# Patient Record
Sex: Female | Born: 1960 | Race: White | Hispanic: No | Marital: Married | Smoking: Former smoker
Health system: Southern US, Community
[De-identification: ages and names within clinical notes are randomized; demographics above are authoritative.]

## PROBLEM LIST (undated history)

## (undated) DIAGNOSIS — F329 Major depressive disorder, single episode, unspecified: Secondary | ICD-10-CM

## (undated) DIAGNOSIS — F419 Anxiety disorder, unspecified: Secondary | ICD-10-CM

## (undated) DIAGNOSIS — I10 Essential (primary) hypertension: Secondary | ICD-10-CM

## (undated) DIAGNOSIS — F32A Depression, unspecified: Secondary | ICD-10-CM

## (undated) DIAGNOSIS — F319 Bipolar disorder, unspecified: Secondary | ICD-10-CM

## (undated) DIAGNOSIS — T7840XA Allergy, unspecified, initial encounter: Secondary | ICD-10-CM

## (undated) DIAGNOSIS — I7300 Raynaud's syndrome without gangrene: Secondary | ICD-10-CM

## (undated) DIAGNOSIS — I499 Cardiac arrhythmia, unspecified: Secondary | ICD-10-CM

## (undated) DIAGNOSIS — M1990 Unspecified osteoarthritis, unspecified site: Secondary | ICD-10-CM

## (undated) DIAGNOSIS — E7800 Pure hypercholesterolemia, unspecified: Secondary | ICD-10-CM

## (undated) HISTORY — DX: Pure hypercholesterolemia, unspecified: E78.00

## (undated) HISTORY — DX: Depression, unspecified: F32.A

## (undated) HISTORY — DX: Anxiety disorder, unspecified: F41.9

## (undated) HISTORY — DX: Bipolar disorder, unspecified: F31.9

## (undated) HISTORY — DX: Allergy, unspecified, initial encounter: T78.40XA

## (undated) HISTORY — DX: Major depressive disorder, single episode, unspecified: F32.9

## (undated) HISTORY — DX: Hemochromatosis, unspecified: E83.119

## (undated) HISTORY — DX: Unspecified osteoarthritis, unspecified site: M19.90

## (undated) HISTORY — DX: Essential (primary) hypertension: I10

## (undated) HISTORY — DX: Raynaud's syndrome without gangrene: I73.00

## (undated) HISTORY — DX: Cardiac arrhythmia, unspecified: I49.9

## (undated) HISTORY — PX: NO PAST SURGERIES: SHX2092

---

## 2006-11-25 ENCOUNTER — Other Ambulatory Visit: Payer: Self-pay

## 2006-11-25 ENCOUNTER — Observation Stay: Payer: Self-pay | Admitting: Internal Medicine

## 2007-07-28 ENCOUNTER — Ambulatory Visit: Payer: Self-pay | Admitting: Unknown Physician Specialty

## 2010-09-20 ENCOUNTER — Ambulatory Visit: Payer: Self-pay | Admitting: Internal Medicine

## 2010-09-25 ENCOUNTER — Ambulatory Visit: Payer: Self-pay | Admitting: Internal Medicine

## 2010-10-11 ENCOUNTER — Ambulatory Visit: Payer: Self-pay | Admitting: Internal Medicine

## 2010-11-27 ENCOUNTER — Ambulatory Visit: Payer: Self-pay | Admitting: Internal Medicine

## 2012-03-05 ENCOUNTER — Ambulatory Visit: Payer: Self-pay | Admitting: Family Medicine

## 2012-05-26 ENCOUNTER — Ambulatory Visit: Payer: Self-pay

## 2012-06-15 ENCOUNTER — Ambulatory Visit: Payer: Self-pay | Admitting: Gastroenterology

## 2013-11-02 ENCOUNTER — Ambulatory Visit: Payer: Self-pay | Admitting: Internal Medicine

## 2013-11-09 LAB — CBC CANCER CENTER
Basophil #: 0.1 x10 3/mm (ref 0.0–0.1)
Basophil %: 1.2 %
Eosinophil #: 0 x10 3/mm (ref 0.0–0.7)
Eosinophil %: 0.5 %
HCT: 40.8 % (ref 35.0–47.0)
HGB: 13.8 g/dL (ref 12.0–16.0)
LYMPHS PCT: 30.6 %
Lymphocyte #: 1.4 x10 3/mm (ref 1.0–3.6)
MCH: 35.1 pg — ABNORMAL HIGH (ref 26.0–34.0)
MCHC: 33.8 g/dL (ref 32.0–36.0)
MCV: 104 fL — AB (ref 80–100)
Monocyte #: 0.5 x10 3/mm (ref 0.2–0.9)
Monocyte %: 10.7 %
Neutrophil #: 2.7 x10 3/mm (ref 1.4–6.5)
Neutrophil %: 57 %
Platelet: 306 x10 3/mm (ref 150–440)
RBC: 3.93 10*6/uL (ref 3.80–5.20)
RDW: 12.8 % (ref 11.5–14.5)
WBC: 4.7 x10 3/mm (ref 3.6–11.0)

## 2013-11-09 LAB — FERRITIN: Ferritin (ARMC): 156 ng/mL (ref 8–388)

## 2013-11-10 ENCOUNTER — Ambulatory Visit: Payer: Self-pay | Admitting: Internal Medicine

## 2013-12-10 ENCOUNTER — Ambulatory Visit: Payer: Self-pay | Admitting: Internal Medicine

## 2013-12-28 ENCOUNTER — Ambulatory Visit: Payer: Self-pay | Admitting: Internal Medicine

## 2013-12-28 LAB — CBC CANCER CENTER
BASOS ABS: 0 x10 3/mm (ref 0.0–0.1)
Basophil %: 1.4 %
Eosinophil #: 0.1 x10 3/mm (ref 0.0–0.7)
Eosinophil %: 2.7 %
HCT: 38 % (ref 35.0–47.0)
HGB: 13.2 g/dL (ref 12.0–16.0)
LYMPHS ABS: 1.4 x10 3/mm (ref 1.0–3.6)
LYMPHS PCT: 42.8 %
MCH: 36 pg — ABNORMAL HIGH (ref 26.0–34.0)
MCHC: 34.9 g/dL (ref 32.0–36.0)
MCV: 103 fL — AB (ref 80–100)
Monocyte #: 0.4 x10 3/mm (ref 0.2–0.9)
Monocyte %: 12.2 %
NEUTROS ABS: 1.4 x10 3/mm (ref 1.4–6.5)
Neutrophil %: 40.9 %
Platelet: 282 x10 3/mm (ref 150–440)
RBC: 3.68 10*6/uL — AB (ref 3.80–5.20)
RDW: 12.2 % (ref 11.5–14.5)
WBC: 3.3 x10 3/mm — AB (ref 3.6–11.0)

## 2013-12-28 LAB — FERRITIN: Ferritin (ARMC): 145 ng/mL (ref 8–388)

## 2014-01-10 ENCOUNTER — Ambulatory Visit: Payer: Self-pay | Admitting: Internal Medicine

## 2014-04-19 ENCOUNTER — Ambulatory Visit: Payer: Self-pay | Admitting: Internal Medicine

## 2015-01-03 ENCOUNTER — Other Ambulatory Visit: Payer: Self-pay

## 2015-01-03 HISTORY — DX: Hemochromatosis, unspecified: E83.119

## 2015-01-04 ENCOUNTER — Inpatient Hospital Stay: Payer: Commercial Managed Care - PPO | Attending: Hematology and Oncology

## 2015-01-04 ENCOUNTER — Inpatient Hospital Stay (HOSPITAL_BASED_OUTPATIENT_CLINIC_OR_DEPARTMENT_OTHER): Payer: Commercial Managed Care - PPO | Admitting: Hematology and Oncology

## 2015-01-04 ENCOUNTER — Inpatient Hospital Stay: Payer: Commercial Managed Care - PPO

## 2015-01-04 ENCOUNTER — Encounter: Payer: Self-pay | Admitting: Hematology and Oncology

## 2015-01-04 DIAGNOSIS — Z79899 Other long term (current) drug therapy: Secondary | ICD-10-CM | POA: Insufficient documentation

## 2015-01-04 DIAGNOSIS — I10 Essential (primary) hypertension: Secondary | ICD-10-CM

## 2015-01-04 DIAGNOSIS — Z87891 Personal history of nicotine dependence: Secondary | ICD-10-CM | POA: Diagnosis not present

## 2015-01-04 LAB — CBC
HCT: 36.7 % (ref 35.0–47.0)
Hemoglobin: 12.7 g/dL (ref 12.0–16.0)
MCH: 35.3 pg — ABNORMAL HIGH (ref 26.0–34.0)
MCHC: 34.6 g/dL (ref 32.0–36.0)
MCV: 101.9 fL — ABNORMAL HIGH (ref 80.0–100.0)
Platelets: 229 10*3/uL (ref 150–440)
RBC: 3.6 MIL/uL — ABNORMAL LOW (ref 3.80–5.20)
RDW: 12.6 % (ref 11.5–14.5)
WBC: 3.7 10*3/uL (ref 3.6–11.0)

## 2015-01-04 LAB — FERRITIN: Ferritin: 114 ng/mL (ref 11–307)

## 2015-01-04 NOTE — Progress Notes (Signed)
Pt has not been seen in 1 year. Had seen Dr Lorre NickGittin in the past for hemochromatosis. Pt doesn't really like MD appts, states she has to get off of work to get there. She is due for her mammogram. She said she would contact her PCP for that appt.

## 2015-01-04 NOTE — Progress Notes (Signed)
[facility]-  Cancer Center  Clinic day:  01/04/2015  Chief Complaint: Lindsey Medina is an 54 y.o. female with hemochromatosis who is seen for initial assessment.  HPI: Patient states that she was diagnosed with hemochromatosis in 2012.  At that time, she presented with "abnormal blood work".  Iron saturation was 66%. 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   She was also noted to have mild neutropenia and macrocytosis.  Differential has included a myelodysplastic syndrome, autoimmune disease, cyclic neutropenia or alcohol use. She has had no issues with recurrent infections.  She has undergone frequent phlebotomy. He states that her ferritin was maintained less than 100 with Dr. Bufford Buttner at the [facility] in [place].  Dr Lorre Nick has maintained a ferritin less than 50.  She was last seen in the medical oncology clinic on 12/28/2013.  At that time, hematocrit was 38, hemoglobin 13.2, MCV 103, platelets 282,000, WBC 3300 with an ANC of 1400.  Ferritin was 145. She underwent a 350 cc phlebotomy.  She became presyncopal.   Review of her chart reveals a workup for her low white count and elevated MCV. On 10/10/2010, she had a normal B12 (410), folic acid and (16.5), ANA, rheumatoid factor, and serum protein electrophoresis. Flow cytometry was negative.  Of note, CBC on 11/09/2013 revealed a WBC 4700 with an ANC 2700 (normal).  Symptomatically, she notes fatigue.  She describes neck and shoulder discomfort for which she takes Aleve.  She has arthritis in her hands.  Past Medical History  Diagnosis Date  . Raynaud phenomenon   . Allergy   . Depression   . Anxiety   . Arthritis   . Bipolar disorder   . Hypercholesteremia   . Hypertension   . Irregular heart beats   . Hemochromatosis 01/03/2015  . Hemochromatosis 01/03/2015   No past surgical history on file.  Family History  Problem Relation Age of Onset  . Leukemia Other   No family history  of hemochromatosis.  Social History:  reports that she quit smoking about 26 years ago. She does not have any smokeless tobacco history on file. Her alcohol and drug histories are not on file.  She drinks 1-2 glasses of wine occasionally.  The patient is alone today.  Allergies: No Known Allergies  Current Medications: Current Outpatient Prescriptions  Medication Sig Dispense Refill  . buPROPion (WELLBUTRIN XL) 150 MG 24 hr tablet Take 300 mg by mouth daily.     . cetirizine (ZYRTEC) 10 MG tablet Take 10 mg by mouth daily.    . clonazePAM (KLONOPIN) 0.5 MG tablet Take 2 mg by mouth.     . spironolactone (ALDACTONE) 50 MG tablet Take 50 mg by mouth 2 (two) times daily.    . divalproex (DEPAKOTE) 250 MG DR tablet Take 250 mg by mouth 2 (two) times daily.    Gerarda Fraction Root 500 MG CAPS Take 1 capsule by mouth 2 (two) times daily.     No current facility-administered medications for this visit.   Review of Systems:  GENERAL:  Stays tired.  Active.  No fevers, sweats or weight loss. PERFORMANCE STATUS (ECOG):  1 HEENT:  No visual changes, runny nose, sore throat, mouth sores or tenderness. Lungs: No shortness of breath or cough.  No hemoptysis. Cardiac:  No chest pain, palpitations, orthopnea, or PND. GI:  No nausea, vomiting, diarrhea, constipation, melena or hematochezia. GU:  No urgency, frequency, dysuria, or hematuria.  Menopause at age 18.  Musculoskeletal:  Neck and shoulders ache.  Arthritis pains in hands.  No muscle tenderness. Extremities:  No pain or swelling. Skin:  No rashes or skin changes. Neuro:  No headache, numbness or weakness, balance or coordination issues. Endocrine:  No diabetes, thyroid issues, hot flashes or night sweats. Psych:  No mood changes, depression or anxiety. Review of systems:  All other systems reviewed and found to be negative.   Physical Exam: Pulse 65, temperature 97.5 F (36.4 C), temperature source Tympanic, height 5' 6.14" (1.68 m), weight 132  lb 4.4 oz (60 kg). GENERAL:  Well developed, well nourished, sitting comfortably in the exam room in no acute distress. MENTAL STATUS:  Alert and oriented to person, place and time. HEAD:  Long dark blonde hair.  Normocephalic, atraumatic, face symmetric, no Cushingoid features. EYES:  Brown eyes.  Pupils equal round and reactive to light and accomodation.  No conjunctivitis or scleral icterus. ENT:  Oropharynx clear without lesion.  Tongue normal. Mucous membranes moist.  RESPIRATORY:  Clear to auscultation without rales, wheezes or rhonchi. CARDIOVASCULAR:  Regular rate and rhythm without murmur, rub or gallop. ABDOMEN:  Soft, non-tender, with active bowel sounds, and no hepatosplenomegaly.  No masses. SKIN:  No rashes, ulcers or lesions. EXTREMITIES: No edema, no skin discoloration or tenderness.  No palpable cords. LYMPH NODES: No palpable cervical, supraclavicular, axillary or inguinal adenopathy  NEUROLOGICAL: Unremarkable. PSYCH:  Appropriate.   Appointment on 01/04/2015  Component Date Value Ref Range Status  . WBC 01/04/2015 3.7  3.6 - 11.0 K/uL Final  . RBC 01/04/2015 3.60* 3.80 - 5.20 MIL/uL Final  . Hemoglobin 01/04/2015 12.7  12.0 - 16.0 g/dL Final  . HCT 16/10/960405/25/2016 36.7  35.0 - 47.0 % Final  . MCV 01/04/2015 101.9* 80.0 - 100.0 fL Final  . MCH 01/04/2015 35.3* 26.0 - 34.0 pg Final  . MCHC 01/04/2015 34.6  32.0 - 36.0 g/dL Final  . RDW 54/09/811905/25/2016 12.6  11.5 - 14.5 % Final  . Platelets 01/04/2015 229  150 - 440 K/uL Final  . Ferritin 01/04/2015 114  11 - 307 ng/mL Final   Assessment:  Lindsey Medina is an 54 y.o. female with hemochromatosis (homozygous H63D).  She undergoes periodic phlebotomy to maintain a ferritin less than 50-100.  Last phlebotomy was 12/28/2013.  At that time, she had a presyncopal event (volume 350 cc).  She has a history of leukopenia and an elevated MCV.  She denies any liver disease. Work-up on 10/10/2010 included a normal B12, folic acid, ANA,  rheumatoid factor, serum protein electrophoresis, and flow cytometry.  CBC on 11/09/2013 revealed a WBC 4700 with an ANC 2700 (normal) with an MCV 104 (elevated).  Her elevated MCV may be secondary to her valproic acid.  Symptomatically, she notes chronic fatigue.  She has arthritis pain in her hands.  Exam is unremarkable.  Plan: 1. Discuss medical history, diagnosis and treatment of hemochromatosis, genetics, complications and monitoring. 2. Labs today:  CBC, ferritin. 3. Discuss ferritin goal.  Will perform phlebotomies if ferritin > 100. 4. Schedule small volume phlebotomy (250-300 cc) this week. 5. Anticipate yearly liver ultrasound. 6. RTC in 6 months for MD assess, labs (CBC, ferritin, hepatitis B and C serologies, AFP), and +/- phlebotomy.   Rosey BathMelissa C Corcoran, MD  01/04/2015, 1:25 PM

## 2015-01-11 ENCOUNTER — Inpatient Hospital Stay: Payer: Commercial Managed Care - PPO | Attending: Hematology and Oncology

## 2015-02-21 ENCOUNTER — Other Ambulatory Visit: Payer: Self-pay | Admitting: Family Medicine

## 2015-02-21 DIAGNOSIS — Z1231 Encounter for screening mammogram for malignant neoplasm of breast: Secondary | ICD-10-CM

## 2015-02-22 ENCOUNTER — Ambulatory Visit: Payer: Commercial Managed Care - PPO

## 2015-02-28 ENCOUNTER — Ambulatory Visit
Admission: RE | Admit: 2015-02-28 | Discharge: 2015-02-28 | Disposition: A | Discharge status: Home / Self Care | Payer: Commercial Managed Care - PPO | Source: Ambulatory Visit | Attending: Family Medicine | Admitting: Family Medicine

## 2015-02-28 DIAGNOSIS — Z1231 Encounter for screening mammogram for malignant neoplasm of breast: Secondary | ICD-10-CM

## 2015-07-14 ENCOUNTER — Other Ambulatory Visit: Payer: Self-pay

## 2015-07-19 ENCOUNTER — Other Ambulatory Visit: Payer: Commercial Managed Care - PPO

## 2015-07-19 ENCOUNTER — Other Ambulatory Visit: Payer: Self-pay | Admitting: Hematology and Oncology

## 2015-07-19 ENCOUNTER — Ambulatory Visit: Payer: Commercial Managed Care - PPO | Admitting: Hematology and Oncology

## 2015-09-14 ENCOUNTER — Ambulatory Visit (INDEPENDENT_AMBULATORY_CARE_PROVIDER_SITE_OTHER): Payer: Commercial Managed Care - PPO | Admitting: Family Medicine

## 2015-09-14 ENCOUNTER — Encounter: Payer: Self-pay | Admitting: Family Medicine

## 2015-09-14 VITALS — BP 122/70 | HR 80 | Ht 64.0 in | Wt 121.0 lb

## 2015-09-14 DIAGNOSIS — J40 Bronchitis, not specified as acute or chronic: Secondary | ICD-10-CM

## 2015-09-14 DIAGNOSIS — J0100 Acute maxillary sinusitis, unspecified: Secondary | ICD-10-CM | POA: Diagnosis not present

## 2015-09-14 DIAGNOSIS — R634 Abnormal weight loss: Secondary | ICD-10-CM

## 2015-09-14 MED ORDER — BENZONATATE 100 MG PO CAPS: 100.0000 mg | ORAL_CAPSULE | Freq: Two times a day (BID) | ORAL | Status: DC | PRN

## 2015-09-14 MED ORDER — AMOXICILLIN 500 MG PO CAPS: 500.0000 mg | ORAL_CAPSULE | Freq: Three times a day (TID) | ORAL | Status: DC

## 2015-09-14 NOTE — Progress Notes (Signed)
Name: Lindsey Medina   MRN: [id number]    DOB: 06-04-1961   Date:09/14/2015       Progress Note  Subjective  Chief Complaint  Chief Complaint  Patient presents with  . Sinusitis    cough and cong    Sinusitis This is a new problem. The current episode started in the past 7 days. The problem has been gradually worsening since onset. The maximum temperature recorded prior to her arrival was 100.4 - 100.9 F. Associated symptoms include chills, congestion, coughing, diaphoresis, ear pain, headaches, sinus pressure, sneezing, a sore throat and swollen glands. Pertinent negatives include no neck pain or shortness of breath. Past treatments include acetaminophen and oral decongestants. The treatment provided mild relief.  Cough This is a new problem. The current episode started in the past 7 days. The cough is productive of purulent sputum and productive of brown sputum. Associated symptoms include chills, ear pain, a fever, headaches, nasal congestion, postnasal drip and a sore throat. Pertinent negatives include no chest pain, heartburn, hemoptysis, myalgias, rash, shortness of breath, weight loss or wheezing. She has tried OTC cough suppressant for the symptoms. The treatment provided no relief. Her past medical history is significant for bronchitis and pneumonia. There is no history of asthma, COPD, emphysema or environmental allergies.    No problem-specific assessment & plan notes found for this encounter.   Past Medical History  Diagnosis Date  . Raynaud phenomenon   . Allergy   . Depression   . Anxiety   . Arthritis   . Bipolar disorder (HCC)   . Hypercholesteremia   . Hypertension   . Irregular heart beats   . Hemochromatosis 01/03/2015  . Hemochromatosis 01/03/2015    History reviewed. No pertinent past surgical history.  Family History  Problem Relation Age of Onset  . Leukemia Other     Social History   Social History  . Marital Status: Married    Spouse Name: N/A  .  Number of Children: N/A  . Years of Education: N/A   Occupational History  . Not on file.   Social History Main Topics  . Smoking status: Former Smoker -- 15 years    Quit date: 12/06/1988  . Smokeless tobacco: Not on file  . Alcohol Use: Not on file  . Drug Use: Not on file  . Sexual Activity: No   Other Topics Concern  . Not on file   Social History Narrative    Allergies  Allergen Reactions  . Morphine Nausea And Vomiting     Review of Systems  Constitutional: Positive for fever, chills and diaphoresis. Negative for weight loss and malaise/fatigue.  HENT: Positive for congestion, ear pain, postnasal drip, sinus pressure, sneezing and sore throat. Negative for ear discharge.   Eyes: Negative for blurred vision.  Respiratory: Positive for cough. Negative for hemoptysis, sputum production, shortness of breath and wheezing.   Cardiovascular: Negative for chest pain, palpitations and leg swelling.  Gastrointestinal: Negative for heartburn, nausea, abdominal pain, diarrhea, constipation, blood in stool and melena.  Genitourinary: Negative for dysuria, urgency, frequency and hematuria.  Musculoskeletal: Negative for myalgias, back pain, joint pain and neck pain.  Skin: Negative for rash.  Neurological: Positive for headaches. Negative for dizziness, tingling, sensory change and focal weakness.  Endo/Heme/Allergies: Positive for polydipsia. Negative for environmental allergies. Does not bruise/bleed easily.       Polyuria  Psychiatric/Behavioral: Negative for depression and suicidal ideas. The patient is not nervous/anxious and does not have insomnia.  Objective  Filed Vitals:   09/14/15 1134  BP: 122/70  Pulse: 80  Height:  (1.626 m)  Weight: 121 lb (54.885 kg)    Physical Exam  Constitutional: She is well-developed, well-nourished, and in no distress. No distress.  HENT:  Head: Normocephalic and atraumatic.  Right Ear: Tympanic membrane, external ear and  ear canal normal.  Left Ear: Tympanic membrane, external ear and ear canal normal.  Nose: Nose normal.  Mouth/Throat: Oropharynx is clear and moist.  Eyes: Conjunctivae and EOM are normal. Pupils are equal, round, and reactive to light. Right eye exhibits no discharge. Left eye exhibits no discharge.  Neck: Normal range of motion. Neck supple. No JVD present. No thyromegaly present.  Cardiovascular: Normal rate, regular rhythm, normal heart sounds and intact distal pulses.  Exam reveals no gallop and no friction rub.   No murmur heard. Pulmonary/Chest: Effort normal and breath sounds normal.  Abdominal: Soft. Bowel sounds are normal. She exhibits no mass. There is no tenderness. There is no guarding.  Musculoskeletal: Normal range of motion. She exhibits no edema.  Lymphadenopathy:    She has no cervical adenopathy.  Neurological: She is alert.  Skin: Skin is warm and dry. She is not diaphoretic.  Psychiatric: Mood and affect normal.  Nursing note and vitals reviewed.     Assessment & Plan  Problem List Items Addressed This Visit    None    Visit Diagnoses    Acute maxillary sinusitis, recurrence not specified    -  Primary    Relevant Medications    amoxicillin (AMOXIL) 500 MG capsule    benzonatate (TESSALON) 100 MG capsule    Bronchitis        Relevant Medications    amoxicillin (AMOXIL) 500 MG capsule    benzonatate (TESSALON) 100 MG capsule    Weight loss             Dr. Hayden [facility]  09/14/2015

## 2015-09-18 ENCOUNTER — Other Ambulatory Visit: Payer: Self-pay | Admitting: Hematology and Oncology

## 2015-09-20 ENCOUNTER — Inpatient Hospital Stay: Payer: Commercial Managed Care - PPO | Attending: Hematology and Oncology

## 2015-09-20 ENCOUNTER — Other Ambulatory Visit
Admission: RE | Admit: 2015-09-20 | Discharge: 2015-09-20 | Disposition: A | Discharge status: Home / Self Care | Payer: Commercial Managed Care - PPO | Source: Ambulatory Visit | Attending: Family Medicine | Admitting: Family Medicine

## 2015-09-20 ENCOUNTER — Inpatient Hospital Stay (HOSPITAL_BASED_OUTPATIENT_CLINIC_OR_DEPARTMENT_OTHER): Payer: Commercial Managed Care - PPO | Admitting: Family Medicine

## 2015-09-20 ENCOUNTER — Inpatient Hospital Stay: Payer: Commercial Managed Care - PPO

## 2015-09-20 ENCOUNTER — Encounter: Payer: Self-pay | Admitting: Family Medicine

## 2015-09-20 ENCOUNTER — Other Ambulatory Visit: Payer: Self-pay | Admitting: Hematology and Oncology

## 2015-09-20 DIAGNOSIS — Z79899 Other long term (current) drug therapy: Secondary | ICD-10-CM | POA: Diagnosis not present

## 2015-09-20 DIAGNOSIS — Z87891 Personal history of nicotine dependence: Secondary | ICD-10-CM | POA: Diagnosis not present

## 2015-09-20 DIAGNOSIS — R5383 Other fatigue: Secondary | ICD-10-CM

## 2015-09-20 DIAGNOSIS — I7300 Raynaud's syndrome without gangrene: Secondary | ICD-10-CM | POA: Insufficient documentation

## 2015-09-20 DIAGNOSIS — R634 Abnormal weight loss: Secondary | ICD-10-CM | POA: Diagnosis present

## 2015-09-20 DIAGNOSIS — M1990 Unspecified osteoarthritis, unspecified site: Secondary | ICD-10-CM | POA: Insufficient documentation

## 2015-09-20 LAB — CBC WITH DIFFERENTIAL/PLATELET
Basophils Absolute: 0 10*3/uL (ref 0–0.1)
Basophils Relative: 1 %
Eosinophils Absolute: 0 10*3/uL (ref 0–0.7)
Eosinophils Relative: 1 %
HCT: 36 % (ref 35.0–47.0)
Hemoglobin: 12.4 g/dL (ref 12.0–16.0)
Lymphocytes Relative: 31 %
Lymphs Abs: 1.3 10*3/uL (ref 1.0–3.6)
MCH: 34.8 pg — ABNORMAL HIGH (ref 26.0–34.0)
MCHC: 34.3 g/dL (ref 32.0–36.0)
MCV: 101.5 fL — ABNORMAL HIGH (ref 80.0–100.0)
Monocytes Absolute: 0.4 10*3/uL (ref 0.2–0.9)
Monocytes Relative: 10 %
Neutro Abs: 2.5 10*3/uL (ref 1.4–6.5)
Neutrophils Relative %: 57 %
Platelets: 332 10*3/uL (ref 150–440)
RBC: 3.55 MIL/uL — ABNORMAL LOW (ref 3.80–5.20)
RDW: 12.6 % (ref 11.5–14.5)
WBC: 4.3 10*3/uL (ref 3.6–11.0)

## 2015-09-20 LAB — FERRITIN: Ferritin: 137 ng/mL (ref 11–307)

## 2015-09-20 LAB — GLUCOSE, RANDOM: Glucose, Bld: 122 mg/dL — ABNORMAL HIGH (ref 65–99)

## 2015-09-20 NOTE — Progress Notes (Signed)
[facility]-  Cancer Center  Clinic day:  09/20/2015  Chief Complaint: Lindsey Medina is an 55 y.o. female with hemochromatosis who is seen for initial assessment.  HPI: Patient states that she was diagnosed with hemochromatosis in 2012.  At that time, she presented with "abnormal blood work".  Iron saturation was 66%. 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   She was also noted to have mild neutropenia and macrocytosis.  Differential has included a myelodysplastic syndrome, autoimmune disease, cyclic neutropenia or alcohol use. She has had no issues with recurrent infections.  She has undergone frequent phlebotomy. He states that her ferritin was maintained less than 100 with Dr. Bufford Buttner at the [facility] in [place].  Dr Lorre Nick has maintained a ferritin less than 50.  She was last seen in the medical oncology clinic on 12/28/2013.  At that time, hematocrit was 38, hemoglobin 13.2, MCV 103, platelets 282,000, WBC 3300 with an ANC of 1400.  Ferritin was 145. She underwent a 350 cc phlebotomy.  She became presyncopal.   Review of her chart reveals a workup for her low white count and elevated MCV. On 10/10/2010, she had a normal B12 (410), folic acid and (16.5), ANA, rheumatoid factor, and serum protein electrophoresis. Flow cytometry was negative.  Of note, CBC on 11/09/2013 revealed a WBC 4700 with an ANC 2700 (normal).  Patient today overall reports feeling very well. She has some chronic fatigue as well as chronic arthritic changes in her hands. She states she has been very very busy with work and that makes her schedule difficult to get here for follow-up.  Past Medical History  Diagnosis Date  . Raynaud phenomenon   . Allergy   . Depression   . Anxiety   . Arthritis   . Bipolar disorder (HCC)   . Hypercholesteremia   . Hypertension   . Irregular heart beats   . Hemochromatosis 01/03/2015  . Hemochromatosis 01/03/2015   History  reviewed. No pertinent past surgical history.  Family History  Problem Relation Age of Onset  . Leukemia Other   No family history of hemochromatosis.  Social History:  reports that she quit smoking about 26 years ago. She has never used smokeless tobacco. She reports that she drinks alcohol. She reports that she does not use illicit drugs.  She drinks 1-2 glasses of wine occasionally.  The patient is alone today.  Allergies:  Allergies  Allergen Reactions  . Morphine Nausea And Vomiting    Current Medications: Current Outpatient Prescriptions  Medication Sig Dispense Refill  . amoxicillin (AMOXIL) 500 MG capsule Take 1 capsule (500 mg total) by mouth 3 (three) times daily. 30 capsule 0  . benzonatate (TESSALON) 100 MG capsule Take 1 capsule (100 mg total) by mouth 2 (two) times daily as needed for cough. 20 capsule 0  . buPROPion (WELLBUTRIN XL) 150 MG 24 hr tablet Take 300 mg by mouth daily. Dr Omelia Blackwater    . cetirizine (ZYRTEC) 10 MG tablet Take 10 mg by mouth daily.    . clonazePAM (KLONOPIN) 0.5 MG tablet Take 2 mg by mouth. Dr Omelia Blackwater    . QUEtiapine (SEROQUEL) 200 MG tablet Take 1 tablet by mouth at bedtime.    Marland Kitchen spironolactone (ALDACTONE) 50 MG tablet Take 50 mg by mouth 2 (two) times daily. derm     No current facility-administered medications for this visit.   Review of Systems:  GENERAL:  Stays tired.  Active.  No fevers, sweats  or weight loss. PERFORMANCE STATUS (ECOG):  1 HEENT:  No visual changes, runny nose, sore throat, mouth sores or tenderness. Lungs: No shortness of breath or cough.  No hemoptysis. Cardiac:  No chest pain, palpitations, orthopnea, or PND. GI:  No nausea, vomiting, diarrhea, constipation, melena or hematochezia. GU:  No urgency, frequency, dysuria, or hematuria.  Menopause at age 47. Musculoskeletal:  Neck and shoulders ache.  Arthritis pains in hands.  No muscle tenderness. Extremities:  No pain or swelling. Skin:  No rashes or skin  changes. Neuro:  No headache, numbness or weakness, balance or coordination issues. Endocrine:  No diabetes, thyroid issues, hot flashes or night sweats. Psych:  No mood changes, depression or anxiety. Review of systems:  All other systems reviewed and found to be negative.   Physical Exam: Blood pressure 130/83, pulse 82, temperature 97.6 F (36.4 C), temperature source Tympanic, resp. rate 18, height  (1.626 m), weight 119 lb 6.1 oz (54.15 kg), SpO2 100 %. GENERAL:  Well developed, well nourished, sitting comfortably in the exam room in no acute distress. MENTAL STATUS:  Alert and oriented to person, place and time. HEAD:  Long dark blonde hair.  Normocephalic, atraumatic, face symmetric, no Cushingoid features. EYES:  Brown eyes.  Pupils equal round and reactive to light and accomodation.  No conjunctivitis or scleral icterus. ENT:  Oropharynx clear without lesion.  Tongue normal. Mucous membranes moist.  RESPIRATORY:  Clear to auscultation without rales, wheezes or rhonchi. CARDIOVASCULAR:  Regular rate and rhythm without murmur, rub or gallop. ABDOMEN:  Soft, non-tender, with active bowel sounds, and no hepatosplenomegaly.  No masses. SKIN:  No rashes, ulcers or lesions. EXTREMITIES: No edema, no skin discoloration or tenderness.  No palpable cords. LYMPH NODES: No palpable cervical, supraclavicular, axillary or inguinal adenopathy  NEUROLOGICAL: Unremarkable. PSYCH:  Appropriate.   Appointment on 09/20/2015  Component Date Value Ref Range Status  . WBC 09/20/2015 4.3  3.6 - 11.0 K/uL Final  . RBC 09/20/2015 3.55* 3.80 - 5.20 MIL/uL Final  . Hemoglobin 09/20/2015 12.4  12.0 - 16.0 g/dL Final  . HCT 16/05/9603 36.0  35.0 - 47.0 % Final  . MCV 09/20/2015 101.5* 80.0 - 100.0 fL Final  . MCH 09/20/2015 34.8* 26.0 - 34.0 pg Final  . MCHC 09/20/2015 34.3  32.0 - 36.0 g/dL Final  . RDW 54/04/8118 12.6  11.5 - 14.5 % Final  . Platelets 09/20/2015 332  150 - 440 K/uL Final  .  Neutrophils Relative % 09/20/2015 57   Final  . Neutro Abs 09/20/2015 2.5  1.4 - 6.5 K/uL Final  . Lymphocytes Relative 09/20/2015 31   Final  . Lymphs Abs 09/20/2015 1.3  1.0 - 3.6 K/uL Final  . Monocytes Relative 09/20/2015 10   Final  . Monocytes Absolute 09/20/2015 0.4  0.2 - 0.9 K/uL Final  . Eosinophils Relative 09/20/2015 1   Final  . Eosinophils Absolute 09/20/2015 0.0  0 - 0.7 K/uL Final  . Basophils Relative 09/20/2015 1   Final  . Basophils Absolute 09/20/2015 0.0  0 - 0.1 K/uL Final  Hospital Outpatient Visit on 09/20/2015  Component Date Value Ref Range Status  . Glucose, Bld 09/20/2015 122* 65 - 99 mg/dL Final   Assessment:  Lindsey Medina is an 55 y.o. female with hemochromatosis (homozygous H63D).  She undergoes periodic phlebotomy to maintain a ferritin less than 50-100.  Last phlebotomy was 12/28/2013.  At that time, she had a presyncopal event (volume 350 cc).  She  has a history of leukopenia and an elevated MCV.  She denies any liver disease. Work-up on 10/10/2010 included a normal B12, folic acid, ANA, rheumatoid factor, serum protein electrophoresis, and flow cytometry.  CBC on 11/09/2013 revealed a WBC 4700 with an ANC 2700 (normal) with an MCV 104 (elevated).  Her elevated MCV may be secondary to her valproic acid.  Symptomatically, she notes chronic fatigue.  She has arthritis pain in her hands.  Exam is unremarkable.  Plan: 1. Labs today:  CBC, ferritin. 2. Discuss ferritin goal.  Will perform phlebotomies if ferritin > 100. 3. Schedule small volume phlebotomy (250-300 cc) as soon as patient's schedule allows. 4. Anticipate yearly liver ultrasound. 5. RTC in 6 months for MD assess, labs and +/- phlebotomy.   Loann Quill, NP  09/20/2015, 12:42 PM

## 2015-09-21 LAB — HEPATITIS B CORE ANTIBODY, TOTAL: Hep B Core Total Ab: NEGATIVE

## 2015-09-21 LAB — HEPATITIS B SURFACE ANTIGEN: Hepatitis B Surface Ag: NEGATIVE

## 2015-09-21 LAB — AFP TUMOR MARKER: AFP-Tumor Marker: 4 ng/mL (ref 0.0–8.3)

## 2015-09-21 LAB — HEPATITIS C ANTIBODY: HCV Ab: <0.1 s/co ratio (ref 0.0–0.9)

## 2015-09-25 ENCOUNTER — Other Ambulatory Visit: Payer: Self-pay

## 2015-09-25 DIAGNOSIS — J0111 Acute recurrent frontal sinusitis: Secondary | ICD-10-CM

## 2015-09-25 MED ORDER — AZITHROMYCIN 250 MG PO TABS: ORAL_TABLET | ORAL | Status: DC

## 2015-10-06 ENCOUNTER — Inpatient Hospital Stay: Payer: Commercial Managed Care - PPO

## 2015-10-13 ENCOUNTER — Ambulatory Visit: Payer: Commercial Managed Care - PPO | Admitting: Family Medicine

## 2015-10-13 ENCOUNTER — Other Ambulatory Visit: Payer: Self-pay

## 2015-10-27 ENCOUNTER — Other Ambulatory Visit: Payer: Self-pay

## 2015-10-27 MED ORDER — CETIRIZINE HCL 10 MG PO TABS: 10.0000 mg | ORAL_TABLET | Freq: Every day | ORAL | Status: AC

## 2016-02-01 ENCOUNTER — Other Ambulatory Visit: Payer: Self-pay | Admitting: Family Medicine

## 2016-02-01 DIAGNOSIS — Z1231 Encounter for screening mammogram for malignant neoplasm of breast: Secondary | ICD-10-CM

## 2016-02-29 ENCOUNTER — Ambulatory Visit
Admission: RE | Admit: 2016-02-29 | Discharge: 2016-02-29 | Disposition: A | Discharge status: Home / Self Care | Payer: Commercial Managed Care - PPO | Source: Ambulatory Visit | Attending: Family Medicine | Admitting: Family Medicine

## 2016-02-29 ENCOUNTER — Other Ambulatory Visit: Payer: Self-pay | Admitting: Family Medicine

## 2016-02-29 DIAGNOSIS — Z1231 Encounter for screening mammogram for malignant neoplasm of breast: Secondary | ICD-10-CM | POA: Insufficient documentation

## 2016-03-19 ENCOUNTER — Other Ambulatory Visit: Payer: Self-pay | Admitting: *Deleted

## 2016-03-19 NOTE — Progress Notes (Signed)
[facility] day:  03/20/16  Chief Complaint: Lindsey Medina is an 55 y.o. female with hemochromatosis who is seen for 6 month assessment.  HPI:  The patient was last seen in the hematology clinic on 09/20/2015.  At that time, she was seen by Georgeanne Nim, NP.  He noted chronic fatigue and has pain in her hans. CBC included a hematocrit of 36, hemoglobin 12.4, and MCV 101.5.  Ferritin was 137. Alpha-fetoprotein was 4 (0-8.3).  Hepatitis B core antibody total, hepatitis B surface antigen, and hepatitis C antibody were negative.  She underwent 300 cc phlebotomy on 10/06/2015.  During the interim, she has done well.  She voices no concerns.   Past Medical History:  Diagnosis Date  . Allergy   . Anxiety   . Arthritis   . Bipolar disorder ([place])   . Depression   . Hemochromatosis 01/03/2015  . Hemochromatosis 01/03/2015  . Hypercholesteremia   . Hypertension   . Irregular heart beats   . Raynaud phenomenon    History reviewed. No pertinent surgical history.  Family History  Problem Relation Age of Onset  . Leukemia Other   No family history of hemochromatosis.  Social History:  reports that she quit smoking about 27 years ago. She quit after 15.00 years of use. She has never used smokeless tobacco. She reports that she drinks alcohol. She reports that she does not use drugs.  She drinks 1-2 glasses of wine occasionally.  She lives in [place].  The patient is alone today.  Allergies:  Allergies  Allergen Reactions  . Morphine Nausea And Vomiting    Current Medications: Current Outpatient Prescriptions  Medication Sig Dispense Refill  . buPROPion (WELLBUTRIN XL) 150 MG 24 hr tablet Take 300 mg by mouth daily. Dr Rosine Door    . cetirizine (ZYRTEC) 10 MG tablet Take 1 tablet (10 mg total) by mouth daily. 30 tablet 5  . clonazePAM (KLONOPIN) 2 MG tablet Take 2 mg by mouth as needed.     Marland Kitchen lisdexamfetamine (VYVANSE) 30 MG capsule Take 30  mg by mouth as needed.    Marland Kitchen QUEtiapine (SEROQUEL) 200 MG tablet Take 1 tablet by mouth at bedtime.    Marland Kitchen spironolactone (ALDACTONE) 50 MG tablet Take 50 mg by mouth 2 (two) times daily. derm    . tretinoin (RETIN-A) 0.025 % cream Apply to whole face at night.  Decrease frequency if too irritating.     No current facility-administered medications for this visit.    Review of Systems:  GENERAL:  Feels fine "getting older".  No fevers, sweats or weight loss. PERFORMANCE STATUS (ECOG):  1 HEENT:  No visual changes, runny nose, sore throat, mouth sores or tenderness. Lungs: No shortness of breath or cough.  No hemoptysis. Cardiac:  No chest pain, palpitations, orthopnea, or PND. GI:  No nausea, vomiting, diarrhea, constipation, melena or hematochezia. GU:  No urgency, frequency, dysuria, or hematuria.  Menopause at age 60. Musculoskeletal:  No unusual aches or arthritis pain.  No muscle tenderness. Extremities:  No pain or swelling. Skin:  Using tanning lotion.  No rashes or skin changes. Neuro:  No headache, numbness or weakness, balance or coordination issues. Endocrine:  No diabetes, thyroid issues, hot flashes or night sweats. Psych:  No mood changes, depression or anxiety. Review of systems:  All other systems reviewed and found to be negative.   Physical Exam: Blood pressure 126/78, pulse 82, temperature (!) 96.9 F (36.1 C), temperature  source Tympanic, resp. rate 18, weight 129 lb 4.8 oz (58.7 kg). GENERAL:  Well developed, well nourished, sitting comfortably in the exam room in no acute distress. MENTAL STATUS:  Alert and oriented to person, place and time. HEAD:  Brown hair pulled back.  Normocephalic, atraumatic, face symmetric, no Cushingoid features. EYES:  Glasses propped on head.  Brown eyes.  Pupils equal round and reactive to light and accomodation.  No conjunctivitis or scleral icterus. ENT:  Oropharynx clear without lesion.  Tongue normal. Mucous membranes moist.   RESPIRATORY:  Clear to auscultation without rales, wheezes or rhonchi. CARDIOVASCULAR:  Regular rate and rhythm without murmur, rub or gallop. ABDOMEN:  Soft, non-tender, with active bowel sounds, and no hepatosplenomegaly.  No masses. SKIN:  Tan.  No rashes, ulcers or lesions. EXTREMITIES: No edema, no skin discoloration or tenderness.  No palpable cords. LYMPH NODES: No palpable cervical, supraclavicular, axillary or inguinal adenopathy  NEUROLOGICAL: Unremarkable. PSYCH:  Appropriate.    Appointment on 03/20/2016  Component Date Value Ref Range Status  . WBC 03/20/2016 3.7  3.6 - 11.0 K/uL Final  . RBC 03/20/2016 3.64* 3.80 - 5.20 MIL/uL Final  . Hemoglobin 03/20/2016 12.7  12.0 - 16.0 g/dL Final  . HCT 03/20/2016 37.1  35.0 - 47.0 % Final  . MCV 03/20/2016 101.9* 80.0 - 100.0 fL Final  . MCH 03/20/2016 34.9* 26.0 - 34.0 pg Final  . MCHC 03/20/2016 34.2  32.0 - 36.0 g/dL Final  . RDW 03/20/2016 12.7  11.5 - 14.5 % Final  . Platelets 03/20/2016 230  150 - 440 K/uL Final  . Neutrophils Relative % 03/20/2016 39  % Final  . Neutro Abs 03/20/2016 1.5  1.4 - 6.5 K/uL Final  . Lymphocytes Relative 03/20/2016 46  % Final  . Lymphs Abs 03/20/2016 1.7  1.0 - 3.6 K/uL Final  . Monocytes Relative 03/20/2016 10  % Final  . Monocytes Absolute 03/20/2016 0.4  0.2 - 0.9 K/uL Final  . Eosinophils Relative 03/20/2016 4  % Final  . Eosinophils Absolute 03/20/2016 0.1  0 - 0.7 K/uL Final  . Basophils Relative 03/20/2016 1  % Final  . Basophils Absolute 03/20/2016 0.0  0 - 0.1 K/uL Final  . Sodium 03/20/2016 138  135 - 145 mmol/L Final  . Potassium 03/20/2016 4.2  3.5 - 5.1 mmol/L Final  . Chloride 03/20/2016 101  101 - 111 mmol/L Final  . CO2 03/20/2016 29  22 - 32 mmol/L Final  . Glucose, Bld 03/20/2016 107* 65 - 99 mg/dL Final  . BUN 03/20/2016 17  6 - 20 mg/dL Final  . Creatinine, Ser 03/20/2016 0.97  0.44 - 1.00 mg/dL Final  . Calcium 03/20/2016 9.1  8.9 - 10.3 mg/dL Final  . Total  Protein 03/20/2016 7.0  6.5 - 8.1 g/dL Final  . Albumin 03/20/2016 4.8  3.5 - 5.0 g/dL Final  . AST 03/20/2016 23  15 - 41 U/L Final  . ALT 03/20/2016 17  14 - 54 U/L Final  . Alkaline Phosphatase 03/20/2016 56  38 - 126 U/L Final  . Total Bilirubin 03/20/2016 0.5  0.3 - 1.2 mg/dL Final  . GFR calc non Af Amer 03/20/2016 >60  >60 mL/min Final  . GFR calc Af Amer 03/20/2016 >60  >60 mL/min Final   Comment: (NOTE) The eGFR has been calculated using the CKD EPI equation. This calculation has not been validated in all clinical situations. eGFR's persistently <60 mL/min signify possible Chronic Kidney Disease.   Georgiann Hahn  gap 03/20/2016 8  5 - 15 Final   Assessment:  ARRABELLA WESTERMAN is an 55 y.o. female with hemochromatosis (homozygous H63D).  She undergoes periodic phlebotomy to maintain a ferritin less than 50-100.  Last phlebotomy was on 10/06/2015 (300 cc).  She experienced a presyncopal event on 12/28/2013 (volume 350 cc).  Alpha-fetoprotein was 4 (0-8.3) on 09/20/2015.    She has a history of leukopenia and macrocytic RBC indices.  She denies any liver disease.  Work-up on 10/10/2010 included a normal I19, folic acid, ANA, rheumatoid factor, serum protein electrophoresis, and flow cytometry.  Hepatitis B core antibody total, hepatitis B surface antigen, and hepatitis C antibody were negative on 09/20/2015.    She has a chronic mildly elevated MCV.  Symptomatically, she denies any complaint.  Exam is unremarkable.  Plan: 1. Labs today:  CBC, CMP, ferritin, AFP. 2. Review ferritin goal.  Will perform small volume phlebotomies (250-300 cc) if ferritin > 50-100 3. Nurse to call patient if phlebotomy needed. 4. Discuss continued ferritin checks every 6 months unless ferritin increasing (patient postmenopausal). 5. Schedule liver ultrasound.  Discuss plan for ultrasound every 6 months to 1 year. 6. RTC in 6 months for MD assess, labs (CBC with diff, CMP, ferritin, AFP, B12, folate, TSH- labs  day before) and +/- phlebotomy.   Lequita Asal, MD  03/20/2016, 9:47 AM

## 2016-03-20 ENCOUNTER — Telehealth: Payer: Self-pay

## 2016-03-20 ENCOUNTER — Inpatient Hospital Stay: Payer: Commercial Managed Care - PPO

## 2016-03-20 ENCOUNTER — Other Ambulatory Visit: Payer: Self-pay | Admitting: *Deleted

## 2016-03-20 ENCOUNTER — Encounter: Payer: Self-pay | Admitting: Hematology and Oncology

## 2016-03-20 ENCOUNTER — Inpatient Hospital Stay (HOSPITAL_BASED_OUTPATIENT_CLINIC_OR_DEPARTMENT_OTHER): Payer: Commercial Managed Care - PPO | Admitting: Hematology and Oncology

## 2016-03-20 ENCOUNTER — Inpatient Hospital Stay: Payer: Commercial Managed Care - PPO | Attending: Hematology and Oncology

## 2016-03-20 DIAGNOSIS — F419 Anxiety disorder, unspecified: Secondary | ICD-10-CM | POA: Diagnosis not present

## 2016-03-20 DIAGNOSIS — I10 Essential (primary) hypertension: Secondary | ICD-10-CM | POA: Diagnosis not present

## 2016-03-20 DIAGNOSIS — E7800 Pure hypercholesterolemia, unspecified: Secondary | ICD-10-CM | POA: Diagnosis not present

## 2016-03-20 DIAGNOSIS — M1990 Unspecified osteoarthritis, unspecified site: Secondary | ICD-10-CM | POA: Diagnosis not present

## 2016-03-20 DIAGNOSIS — I7300 Raynaud's syndrome without gangrene: Secondary | ICD-10-CM | POA: Insufficient documentation

## 2016-03-20 DIAGNOSIS — Z87891 Personal history of nicotine dependence: Secondary | ICD-10-CM | POA: Insufficient documentation

## 2016-03-20 DIAGNOSIS — Z79899 Other long term (current) drug therapy: Secondary | ICD-10-CM

## 2016-03-20 DIAGNOSIS — F319 Bipolar disorder, unspecified: Secondary | ICD-10-CM | POA: Diagnosis not present

## 2016-03-20 LAB — COMPREHENSIVE METABOLIC PANEL
ALT: 17 U/L (ref 14–54)
AST: 23 U/L (ref 15–41)
Albumin: 4.8 g/dL (ref 3.5–5.0)
Alkaline Phosphatase: 56 U/L (ref 38–126)
Anion gap: 8 (ref 5–15)
BUN: 17 mg/dL (ref 6–20)
CO2: 29 mmol/L (ref 22–32)
Calcium: 9.1 mg/dL (ref 8.9–10.3)
Chloride: 101 mmol/L (ref 101–111)
Creatinine, Ser: 0.97 mg/dL (ref 0.44–1.00)
GFR calc Af Amer: >60 mL/min (ref >60)
GFR calc non Af Amer: >60 mL/min (ref >60)
Glucose, Bld: 107 mg/dL — ABNORMAL HIGH (ref 65–99)
Potassium: 4.2 mmol/L (ref 3.5–5.1)
Sodium: 138 mmol/L (ref 135–145)
Total Bilirubin: 0.5 mg/dL (ref 0.3–1.2)
Total Protein: 7 g/dL (ref 6.5–8.1)

## 2016-03-20 LAB — CBC WITH DIFFERENTIAL/PLATELET
Basophils Absolute: 0 10*3/uL (ref 0–0.1)
Basophils Relative: 1 %
Eosinophils Absolute: 0.1 10*3/uL (ref 0–0.7)
Eosinophils Relative: 4 %
HCT: 37.1 % (ref 35.0–47.0)
Hemoglobin: 12.7 g/dL (ref 12.0–16.0)
Lymphocytes Relative: 46 %
Lymphs Abs: 1.7 10*3/uL (ref 1.0–3.6)
MCH: 34.9 pg — ABNORMAL HIGH (ref 26.0–34.0)
MCHC: 34.2 g/dL (ref 32.0–36.0)
MCV: 101.9 fL — ABNORMAL HIGH (ref 80.0–100.0)
Monocytes Absolute: 0.4 10*3/uL (ref 0.2–0.9)
Monocytes Relative: 10 %
Neutro Abs: 1.5 10*3/uL (ref 1.4–6.5)
Neutrophils Relative %: 39 %
Platelets: 230 10*3/uL (ref 150–440)
RBC: 3.64 MIL/uL — ABNORMAL LOW (ref 3.80–5.20)
RDW: 12.7 % (ref 11.5–14.5)
WBC: 3.7 10*3/uL (ref 3.6–11.0)

## 2016-03-20 LAB — FERRITIN: Ferritin: 94 ng/mL (ref 11–307)

## 2016-03-20 NOTE — Telephone Encounter (Signed)
-----   Message from Rosey BathMelissa C Corcoran, MD sent at 03/20/2016 12:32 PM EDT ----- Regarding: Please call patient  Please call patient. Ferritin 94.  Please schedule phlebotomy.  M

## 2016-03-20 NOTE — Progress Notes (Signed)
Patient is here today for follow up, no complaints.  

## 2016-03-20 NOTE — Telephone Encounter (Signed)
Called patent and notified her of results. Sent message to Vernona RiegerLaura for her to schedule time of patient phlebotomy

## 2016-03-21 LAB — AFP TUMOR MARKER: AFP-Tumor Marker: 5.4 ng/mL (ref 0.0–8.3)

## 2016-03-25 ENCOUNTER — Ambulatory Visit
Admission: RE | Admit: 2016-03-25 | Discharge: 2016-03-25 | Disposition: A | Discharge status: Home / Self Care | Payer: Commercial Managed Care - PPO | Source: Ambulatory Visit | Attending: Hematology and Oncology | Admitting: Hematology and Oncology

## 2016-03-25 DIAGNOSIS — R932 Abnormal findings on diagnostic imaging of liver and biliary tract: Secondary | ICD-10-CM | POA: Insufficient documentation

## 2016-03-27 ENCOUNTER — Other Ambulatory Visit: Payer: Self-pay | Admitting: Hematology and Oncology

## 2016-03-27 ENCOUNTER — Inpatient Hospital Stay: Payer: Commercial Managed Care - PPO

## 2016-09-17 ENCOUNTER — Inpatient Hospital Stay: Payer: Commercial Managed Care - PPO | Attending: Hematology and Oncology

## 2016-09-17 DIAGNOSIS — I7300 Raynaud's syndrome without gangrene: Secondary | ICD-10-CM | POA: Diagnosis not present

## 2016-09-17 DIAGNOSIS — Z87891 Personal history of nicotine dependence: Secondary | ICD-10-CM | POA: Insufficient documentation

## 2016-09-17 DIAGNOSIS — I10 Essential (primary) hypertension: Secondary | ICD-10-CM | POA: Insufficient documentation

## 2016-09-17 DIAGNOSIS — E7800 Pure hypercholesterolemia, unspecified: Secondary | ICD-10-CM | POA: Insufficient documentation

## 2016-09-17 DIAGNOSIS — F319 Bipolar disorder, unspecified: Secondary | ICD-10-CM | POA: Insufficient documentation

## 2016-09-17 DIAGNOSIS — Z79899 Other long term (current) drug therapy: Secondary | ICD-10-CM | POA: Diagnosis not present

## 2016-09-17 DIAGNOSIS — F419 Anxiety disorder, unspecified: Secondary | ICD-10-CM | POA: Insufficient documentation

## 2016-09-17 DIAGNOSIS — K5900 Constipation, unspecified: Secondary | ICD-10-CM | POA: Insufficient documentation

## 2016-09-17 LAB — CBC WITH DIFFERENTIAL/PLATELET
Basophils Absolute: 0 10*3/uL (ref 0–0.1)
Basophils Relative: 1 %
Eosinophils Absolute: 0.1 10*3/uL (ref 0–0.7)
Eosinophils Relative: 3 %
HCT: 35.1 % (ref 35.0–47.0)
Hemoglobin: 11.9 g/dL — ABNORMAL LOW (ref 12.0–16.0)
Lymphocytes Relative: 44 %
Lymphs Abs: 1.7 10*3/uL (ref 1.0–3.6)
MCH: 34.6 pg — ABNORMAL HIGH (ref 26.0–34.0)
MCHC: 34 g/dL (ref 32.0–36.0)
MCV: 101.7 fL — ABNORMAL HIGH (ref 80.0–100.0)
Monocytes Absolute: 0.5 10*3/uL (ref 0.2–0.9)
Monocytes Relative: 13 %
Neutro Abs: 1.4 10*3/uL (ref 1.4–6.5)
Neutrophils Relative %: 39 %
Platelets: 226 10*3/uL (ref 150–440)
RBC: 3.45 MIL/uL — ABNORMAL LOW (ref 3.80–5.20)
RDW: 12.6 % (ref 11.5–14.5)
WBC: 3.7 10*3/uL (ref 3.6–11.0)

## 2016-09-17 LAB — COMPREHENSIVE METABOLIC PANEL
ALT: 18 U/L (ref 14–54)
AST: 33 U/L (ref 15–41)
Albumin: 4.4 g/dL (ref 3.5–5.0)
Alkaline Phosphatase: 49 U/L (ref 38–126)
Anion gap: 8 (ref 5–15)
BUN: 13 mg/dL (ref 6–20)
CO2: 27 mmol/L (ref 22–32)
Calcium: 9.1 mg/dL (ref 8.9–10.3)
Chloride: 100 mmol/L — ABNORMAL LOW (ref 101–111)
Creatinine, Ser: 0.91 mg/dL (ref 0.44–1.00)
GFR calc Af Amer: >60 mL/min (ref >60)
GFR calc non Af Amer: >60 mL/min (ref >60)
Glucose, Bld: 120 mg/dL — ABNORMAL HIGH (ref 65–99)
Potassium: 3.9 mmol/L (ref 3.5–5.1)
Sodium: 135 mmol/L (ref 135–145)
Total Bilirubin: 0.5 mg/dL (ref 0.3–1.2)
Total Protein: 6.8 g/dL (ref 6.5–8.1)

## 2016-09-17 LAB — VITAMIN B12: Vitamin B-12: 192 pg/mL (ref 180–914)

## 2016-09-17 LAB — FERRITIN: Ferritin: 77 ng/mL (ref 11–307)

## 2016-09-17 LAB — FOLATE: Folate: 10.3 ng/mL (ref >5.9)

## 2016-09-17 LAB — TSH: TSH: 1.321 u[IU]/mL (ref 0.350–4.500)

## 2016-09-18 ENCOUNTER — Inpatient Hospital Stay (HOSPITAL_BASED_OUTPATIENT_CLINIC_OR_DEPARTMENT_OTHER): Payer: Commercial Managed Care - PPO | Admitting: Hematology and Oncology

## 2016-09-18 ENCOUNTER — Encounter: Payer: Self-pay | Admitting: Hematology and Oncology

## 2016-09-18 ENCOUNTER — Inpatient Hospital Stay: Payer: Commercial Managed Care - PPO

## 2016-09-18 DIAGNOSIS — D7589 Other specified diseases of blood and blood-forming organs: Secondary | ICD-10-CM

## 2016-09-18 DIAGNOSIS — E538 Deficiency of other specified B group vitamins: Secondary | ICD-10-CM

## 2016-09-18 NOTE — Progress Notes (Signed)
[facility] day:  09/18/16  Chief Complaint: Lindsey Medina is an 56 y.o. female with hemochromatosis who is seen for 6 month assessment.  HPI:  The patient was last seen in the hematology clinic on 03/20/2016.  At that time, she denied any complaint.  Exam was unremarkable.  Ferritin was 94.  She underwent phlebotomy on 03/27/2016.  Labs on 09/17/2016 revealed a hematocrit of 35.1, hemoglobin 11.9, MCV 101.7, WBC 3700 with an ANC of 1400.  Ferritin was 77.  Folate was 10.3 with a B12 of 192 (low normal).  TSH was 1.321 (lnormal).    Abdominal ultrasound on 03/25/2016 revealed slight increase echotexture throughout the liver without focal abnormality.  Symptomatically, she is doing well.  She notes some constipation.  She feels like she aches a little more as she has gotten older,   Past Medical History:  Diagnosis Date  . Allergy   . Anxiety   . Arthritis   . Bipolar disorder ([place])   . Depression   . Hemochromatosis 01/03/2015  . Hemochromatosis 01/03/2015  . Hypercholesteremia   . Hypertension   . Irregular heart beats   . Raynaud phenomenon    History reviewed. No pertinent surgical history.  Family History  Problem Relation Age of Onset  . Leukemia Other   No family history of hemochromatosis.  Social History:  reports that she quit smoking about 27 years ago. She quit after 15.00 years of use. She has never used smokeless tobacco. She reports that she drinks alcohol. She reports that she does not use drugs.  She drinks 1-2 glasses of wine occasionally.  She lives in [place]. Insurance changes on 11/10/2016.  The patient is alone today.  Allergies:  Allergies  Allergen Reactions  . Morphine Nausea And Vomiting    Current Medications: Current Outpatient Prescriptions  Medication Sig Dispense Refill  . buPROPion (WELLBUTRIN XL) 150 MG 24 hr tablet Take 300 mg by mouth daily. Dr Rosine Door    . cetirizine (ZYRTEC) 10 MG tablet  Take 1 tablet (10 mg total) by mouth daily. 30 tablet 5  . clonazePAM (KLONOPIN) 2 MG tablet Take 2 mg by mouth as needed.     Marland Kitchen lisdexamfetamine (VYVANSE) 30 MG capsule Take 30 mg by mouth as needed.    Marland Kitchen MYDAYIS 37.5 MG CP24 Take 37.5 mg by mouth daily.    . QUEtiapine (SEROQUEL) 200 MG tablet Take 1 tablet by mouth at bedtime.    Marland Kitchen spironolactone (ALDACTONE) 50 MG tablet Take 50 mg by mouth 2 (two) times daily. derm    . tretinoin (RETIN-A) 0.025 % cream Apply to whole face at night.  Decrease frequency if too irritating.     No current facility-administered medications for this visit.    Review of Systems:  GENERAL:  Feels fine "getting older".  No fevers or sweats.  Weight loss of 1 pound. PERFORMANCE STATUS (ECOG):  1 HEENT:  No visual changes, runny nose, sore throat, mouth sores or tenderness. Lungs: No shortness of breath or cough.  No hemoptysis. Cardiac:  No chest pain, palpitations, orthopnea, or PND. GI:  Constipation.  No nausea, vomiting, diarrhea, melena or hematochezia. GU:  No urgency, frequency, dysuria, or hematuria.  Menopause at age 83. Musculoskeletal:  No unusual aches or arthritis pain.  No muscle tenderness. Extremities:  No pain or swelling. Skin:  Using tanning lotion.  No rashes or skin changes. Neuro:  No headache, numbness or weakness, balance or coordination  issues. Endocrine:  No diabetes, thyroid issues, hot flashes or night sweats. Psych:  No mood changes, depression or anxiety. Review of systems:  All other systems reviewed and found to be negative.   Physical Exam: Blood pressure 123/79, pulse 89, temperature 98.1 F (36.7 C), temperature source Tympanic, resp. rate 18, weight 128 lb 1.4 oz (58.1 kg). GENERAL:  Well developed, well nourished, woman sitting comfortably in the exam room in no acute distress. MENTAL STATUS:  Alert and oriented to person, place and time. HEAD:  Brown hair.  Normocephalic, atraumatic, face symmetric, no Cushingoid  features. EYES:  Brown eyes.  Pupils equal round and reactive to light and accomodation.  No conjunctivitis or scleral icterus. ENT:  Oropharynx clear without lesion.  Tongue normal. Mucous membranes moist.  RESPIRATORY:  Clear to auscultation without rales, wheezes or rhonchi. CARDIOVASCULAR:  Regular rate and rhythm without murmur, rub or gallop. ABDOMEN:  Soft, non-tender, with active bowel sounds, and no hepatosplenomegaly.  No masses. SKIN:  Tan.  No rashes, ulcers or lesions. EXTREMITIES: No edema, no skin discoloration or tenderness.  No palpable cords. LYMPH NODES: No palpable cervical, supraclavicular, axillary or inguinal adenopathy  NEUROLOGICAL: Unremarkable. PSYCH:  Appropriate.    Appointment on 09/17/2016  Component Date Value Ref Range Status  . WBC 09/17/2016 3.7  3.6 - 11.0 K/uL Final  . RBC 09/17/2016 3.45* 3.80 - 5.20 MIL/uL Final  . Hemoglobin 09/17/2016 11.9* 12.0 - 16.0 g/dL Final  . HCT 09/17/2016 35.1  35.0 - 47.0 % Final  . MCV 09/17/2016 101.7* 80.0 - 100.0 fL Final  . MCH 09/17/2016 34.6* 26.0 - 34.0 pg Final  . MCHC 09/17/2016 34.0  32.0 - 36.0 g/dL Final  . RDW 09/17/2016 12.6  11.5 - 14.5 % Final  . Platelets 09/17/2016 226  150 - 440 K/uL Final  . Neutrophils Relative % 09/17/2016 39  % Final  . Neutro Abs 09/17/2016 1.4  1.4 - 6.5 K/uL Final  . Lymphocytes Relative 09/17/2016 44  % Final  . Lymphs Abs 09/17/2016 1.7  1.0 - 3.6 K/uL Final  . Monocytes Relative 09/17/2016 13  % Final  . Monocytes Absolute 09/17/2016 0.5  0.2 - 0.9 K/uL Final  . Eosinophils Relative 09/17/2016 3  % Final  . Eosinophils Absolute 09/17/2016 0.1  0 - 0.7 K/uL Final  . Basophils Relative 09/17/2016 1  % Final  . Basophils Absolute 09/17/2016 0.0  0 - 0.1 K/uL Final  . Sodium 09/17/2016 135  135 - 145 mmol/L Final  . Potassium 09/17/2016 3.9  3.5 - 5.1 mmol/L Final  . Chloride 09/17/2016 100* 101 - 111 mmol/L Final  . CO2 09/17/2016 27  22 - 32 mmol/L Final  . Glucose,  Bld 09/17/2016 120* 65 - 99 mg/dL Final  . BUN 09/17/2016 13  6 - 20 mg/dL Final  . Creatinine, Ser 09/17/2016 0.91  0.44 - 1.00 mg/dL Final  . Calcium 09/17/2016 9.1  8.9 - 10.3 mg/dL Final  . Total Protein 09/17/2016 6.8  6.5 - 8.1 g/dL Final  . Albumin 09/17/2016 4.4  3.5 - 5.0 g/dL Final  . AST 09/17/2016 33  15 - 41 U/L Final  . ALT 09/17/2016 18  14 - 54 U/L Final  . Alkaline Phosphatase 09/17/2016 49  38 - 126 U/L Final  . Total Bilirubin 09/17/2016 0.5  0.3 - 1.2 mg/dL Final  . GFR calc non Af Amer 09/17/2016 >60  >60 mL/min Final  . GFR calc Af Amer 09/17/2016 >60  >  60 mL/min Final   Comment: (NOTE) The eGFR has been calculated using the CKD EPI equation. This calculation has not been validated in all clinical situations. eGFR's persistently <60 mL/min signify possible Chronic Kidney Disease.   . Anion gap 09/17/2016 8  5 - 15 Final  . Ferritin 09/17/2016 77  11 - 307 ng/mL Final  . Vitamin B-12 09/17/2016 192  180 - 914 pg/mL Final   Comment: (NOTE) This assay is not validated for testing neonatal or myeloproliferative syndrome specimens for Vitamin B12 levels. Performed at [facility], [place] [address] [place], [place] [address]   . Folate 09/17/2016 10.3  >5.9 ng/mL Final  . TSH 09/17/2016 1.321  0.350 - 4.500 uIU/mL Final   Assessment:  TONIE ELSEY is an 56 y.o. female with hemochromatosis (homozygous H63D).  She undergoes periodic phlebotomy to maintain a ferritin less than 50-100.  Last phlebotomy was on 03/27/2016 (300 cc).  She experienced a presyncopal event on 12/28/2013 (volume 350 cc).  Alpha-fetoprotein was 4 (0-8.3) on 09/20/2015 and 5.4 on 03/20/2016.    Abdominal ultrasound on 03/25/2016 revealed slight increase echotexture throughout the liver without focal abnormality.  She has a history of leukopenia and macrocytic RBC indices.  She denies any liver disease.  Work-up on 10/10/2010 included a normal L87, folic acid, ANA, rheumatoid factor,  serum protein electrophoresis, and flow cytometry.  Hepatitis B core antibody total, hepatitis B surface antigen, and hepatitis C antibody were negative on 09/20/2015.    Symptomatically, she feels good except for "getting older".  Exam is unremarkable.  Plan: 1. Review labs from yesterday.  Discuss low normal B12 and checking for B12 deficiency (MMA). 2. Review ferritin goal.  Continue small volume phlebotomies (250-300 cc) if ferritin > 50-100.  Suspect will need phlebotomy within 3 months. 3. Review last liver ultrasound.  Continue check every 6 months. 4. Schedule RUQ ultrasound. 5. Labs today:  MMA, AFP. 6. RTC on 11/05/2016 for labs (CBC, ferritin) +/- phlebotomy on 11/06/2016. 7. RTC in 6 months for MD assessment,  labs (CBC with diff, CMP, ferritin, AFP- labs day before) and +/- phlebotomy.   Lequita Asal, MD  09/18/2016, 9:20 AM

## 2016-09-18 NOTE — Progress Notes (Signed)
Patient offers no concerns today. 

## 2016-09-19 LAB — AFP TUMOR MARKER: AFP-Tumor Marker: 4.4 ng/mL (ref 0.0–8.3)

## 2016-09-21 LAB — METHYLMALONIC ACID, SERUM: Methylmalonic Acid, Quantitative: 256 nmol/L (ref 0–378)

## 2016-09-23 ENCOUNTER — Ambulatory Visit: Payer: Commercial Managed Care - PPO

## 2016-09-23 ENCOUNTER — Ambulatory Visit
Admission: RE | Admit: 2016-09-23 | Discharge: 2016-09-23 | Disposition: A | Discharge status: Home / Self Care | Payer: Commercial Managed Care - PPO | Source: Ambulatory Visit | Attending: Hematology and Oncology | Admitting: Hematology and Oncology

## 2016-09-23 DIAGNOSIS — E538 Deficiency of other specified B group vitamins: Secondary | ICD-10-CM

## 2016-09-23 DIAGNOSIS — D7589 Other specified diseases of blood and blood-forming organs: Secondary | ICD-10-CM | POA: Diagnosis present

## 2016-09-23 DIAGNOSIS — R7989 Other specified abnormal findings of blood chemistry: Secondary | ICD-10-CM | POA: Insufficient documentation

## 2016-09-26 ENCOUNTER — Telehealth: Payer: Self-pay | Admitting: *Deleted

## 2016-09-26 NOTE — Telephone Encounter (Signed)
Called patient and informed her that her MMA level was normal and that she did not require vit B12 at this time per Dr. Merlene Pullingorcoran, patient voiced understanding.

## 2016-10-02 ENCOUNTER — Telehealth: Payer: Self-pay | Admitting: *Deleted

## 2016-10-02 NOTE — Telephone Encounter (Signed)
Patient called to discuss her B12 injections, reinterated that Dr. Merlene Pullingorcoran reported that her MMA was wnl and that she did not require b12 injections at this time, voiced understanding.

## 2016-11-05 ENCOUNTER — Inpatient Hospital Stay: Payer: Commercial Managed Care - PPO | Attending: Hematology and Oncology

## 2016-11-05 ENCOUNTER — Inpatient Hospital Stay: Payer: Commercial Managed Care - PPO

## 2016-11-05 DIAGNOSIS — E538 Deficiency of other specified B group vitamins: Secondary | ICD-10-CM

## 2016-11-05 DIAGNOSIS — I10 Essential (primary) hypertension: Secondary | ICD-10-CM | POA: Insufficient documentation

## 2016-11-05 DIAGNOSIS — F319 Bipolar disorder, unspecified: Secondary | ICD-10-CM | POA: Insufficient documentation

## 2016-11-05 DIAGNOSIS — E7800 Pure hypercholesterolemia, unspecified: Secondary | ICD-10-CM | POA: Diagnosis not present

## 2016-11-05 DIAGNOSIS — I7300 Raynaud's syndrome without gangrene: Secondary | ICD-10-CM | POA: Diagnosis not present

## 2016-11-05 DIAGNOSIS — D7589 Other specified diseases of blood and blood-forming organs: Secondary | ICD-10-CM

## 2016-11-05 DIAGNOSIS — K5900 Constipation, unspecified: Secondary | ICD-10-CM | POA: Diagnosis not present

## 2016-11-05 DIAGNOSIS — Z87891 Personal history of nicotine dependence: Secondary | ICD-10-CM | POA: Diagnosis not present

## 2016-11-05 DIAGNOSIS — F419 Anxiety disorder, unspecified: Secondary | ICD-10-CM | POA: Insufficient documentation

## 2016-11-05 DIAGNOSIS — Z79899 Other long term (current) drug therapy: Secondary | ICD-10-CM | POA: Insufficient documentation

## 2016-11-05 LAB — CBC WITH DIFFERENTIAL/PLATELET
Basophils Absolute: 0 10*3/uL (ref 0–0.1)
Basophils Relative: 1 %
Eosinophils Absolute: 0 10*3/uL (ref 0–0.7)
Eosinophils Relative: 1 %
HCT: 36.9 % (ref 35.0–47.0)
Hemoglobin: 12.5 g/dL (ref 12.0–16.0)
Lymphocytes Relative: 28 %
Lymphs Abs: 1.3 10*3/uL (ref 1.0–3.6)
MCH: 34.3 pg — ABNORMAL HIGH (ref 26.0–34.0)
MCHC: 33.9 g/dL (ref 32.0–36.0)
MCV: 101.2 fL — ABNORMAL HIGH (ref 80.0–100.0)
Monocytes Absolute: 0.4 10*3/uL (ref 0.2–0.9)
Monocytes Relative: 9 %
Neutro Abs: 2.8 10*3/uL (ref 1.4–6.5)
Neutrophils Relative %: 61 %
Platelets: 251 10*3/uL (ref 150–440)
RBC: 3.65 MIL/uL — ABNORMAL LOW (ref 3.80–5.20)
RDW: 12.1 % (ref 11.5–14.5)
WBC: 4.5 10*3/uL (ref 3.6–11.0)

## 2016-11-05 LAB — FERRITIN: Ferritin: 68 ng/mL (ref 11–307)

## 2016-11-06 ENCOUNTER — Other Ambulatory Visit: Payer: Self-pay | Admitting: Hematology and Oncology

## 2016-11-06 ENCOUNTER — Inpatient Hospital Stay: Payer: Commercial Managed Care - PPO

## 2016-11-06 ENCOUNTER — Ambulatory Visit: Payer: Commercial Managed Care - PPO | Admitting: Hematology and Oncology

## 2016-11-06 NOTE — Patient Instructions (Signed)
     Therapeutic Phlebotomy, Care After Refer to this sheet in the next few weeks. These instructions provide you with information about caring for yourself after your procedure. Your health care provider may also give you more specific instructions. Your treatment has been planned according to current medical practices, but problems sometimes occur. Call your health care provider if you have any problems or questions after your procedure. What can I expect after the procedure? After the procedure, it is common to have:  Light-headedness or dizziness. You may feel faint.  Nausea.  Tiredness. Follow these instructions at home: Activity  Return to your normal activities as directed by your health care provider. Most people can go back to their normal activities right away.  Avoid strenuous physical activity and heavy lifting or pulling for about 5 hours after the procedure. Do not lift anything that is heavier than 10 lb (4.5 kg).  Athletes should avoid strenuous exercise for at least 12 hours.  Change positions slowly for the remainder of the day. This will help to prevent light-headedness or fainting.  If you feel light-headed, lie down until the feeling goes away. Eating and drinking  Be sure to eat well-balanced meals for the next 24 hours.  Drink enough fluid to keep your urine clear or pale yellow.  Avoid drinking alcohol on the day that you had the procedure. Care of the Needle Insertion Site  Keep your bandage dry. You can remove the bandage after about 5 hours or as directed by your health care provider.  If you have bleeding from the needle insertion site, elevate your arm and press firmly on the site until the bleeding stops.  If you have bruising at the site, apply ice to the area:  Put ice in a plastic bag.  Place a towel between your skin and the bag.  Leave the ice on for 20 minutes, 2-3 times a day for the first 24 hours.  If the swelling does not go away  after 24 hours, apply a warm, moist washcloth to the area for 20 minutes, 2-3 times a day. General instructions  Avoid smoking for at least 30 minutes after the procedure.  Keep all follow-up visits as directed by your health care provider. It is important to continue with further therapeutic phlebotomy treatments as directed. Contact a health care provider if:  You have redness, swelling, or pain at the needle insertion site.  You have fluid, blood, or pus coming from the needle insertion site.  You feel light-headed, dizzy, or nauseated, and the feeling does not go away.  You notice new bruising at the needle insertion site.  You feel weaker than normal.  You have a fever or chills. Get help right away if:  You have severe nausea or vomiting.  You have chest pain.  You have trouble breathing. This information is not intended to replace advice given to you by your health care provider. Make sure you discuss any questions you have with your health care provider. Document Released: 12/31/2010 Document Revised: 03/30/2016 Document Reviewed: 07/25/2014 Elsevier Interactive Patient Education  2017 [employer]  

## 2017-02-13 IMAGING — MG MM DIGITAL SCREENING BILAT W/ TOMO W/ CAD
5 series · 5 of 5 positions shown · non-contrast
Comparison: Previous exam(s).

CLINICAL DATA: Screening.

EXAM:
2D DIGITAL SCREENING BILATERAL MAMMOGRAM WITH CAD AND ADJUNCT TOMO

[R CC (1 of 2)]
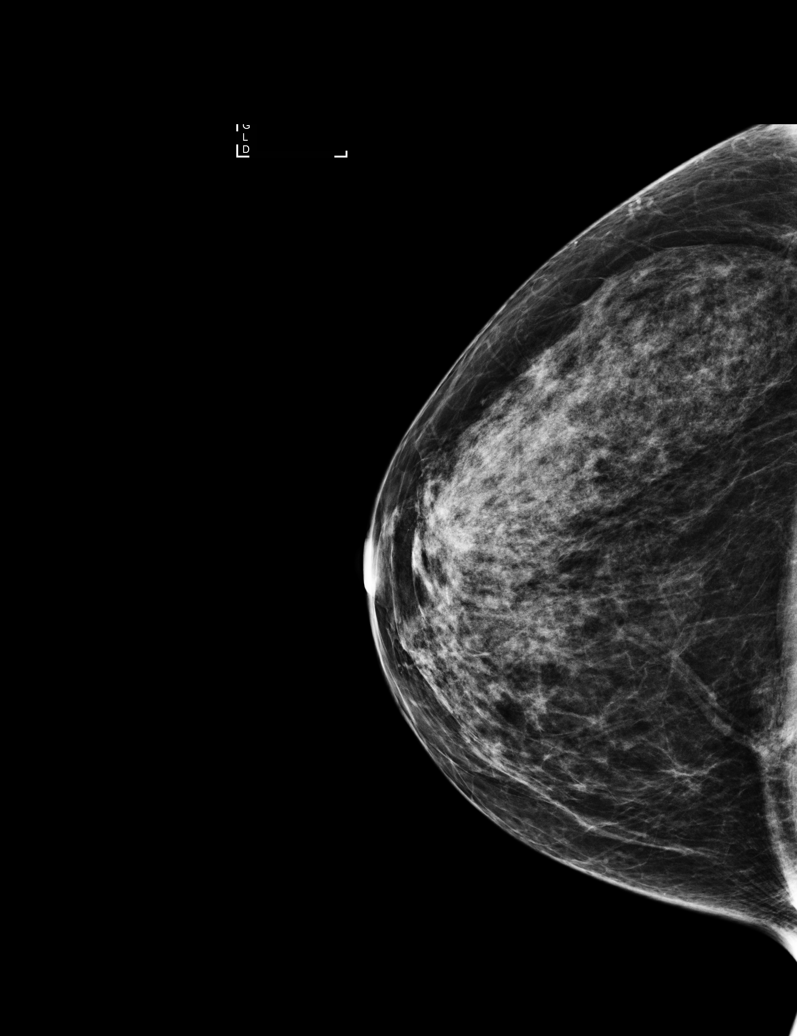

[L CC]
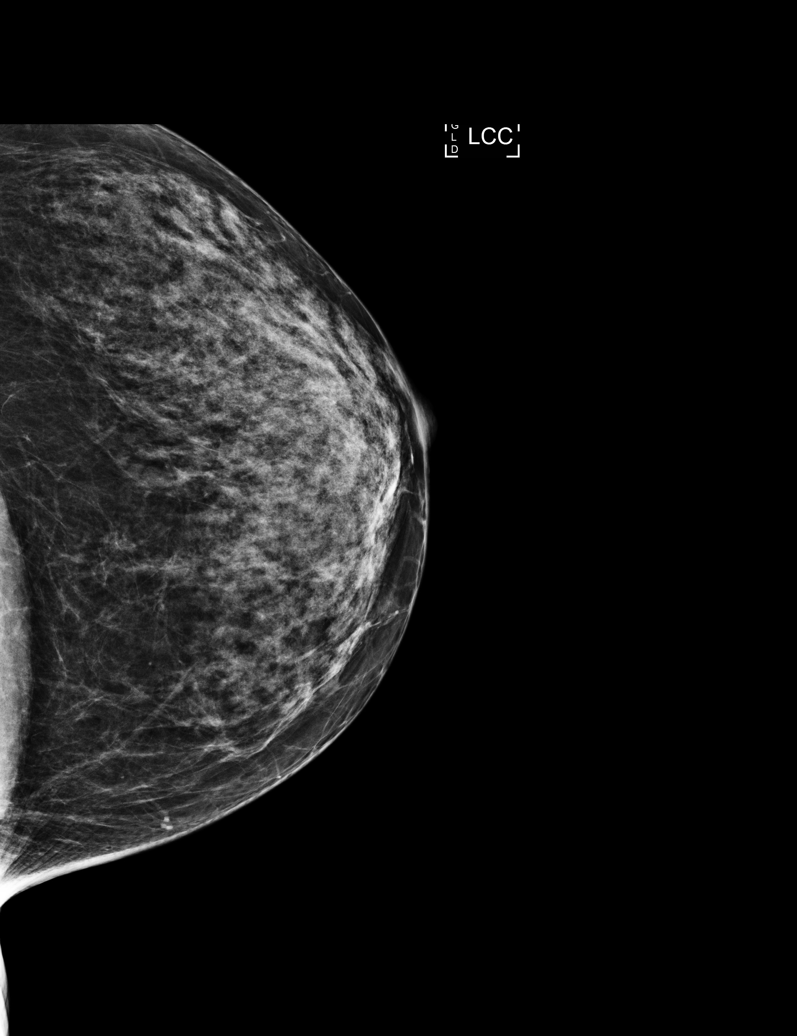

[L MLO]
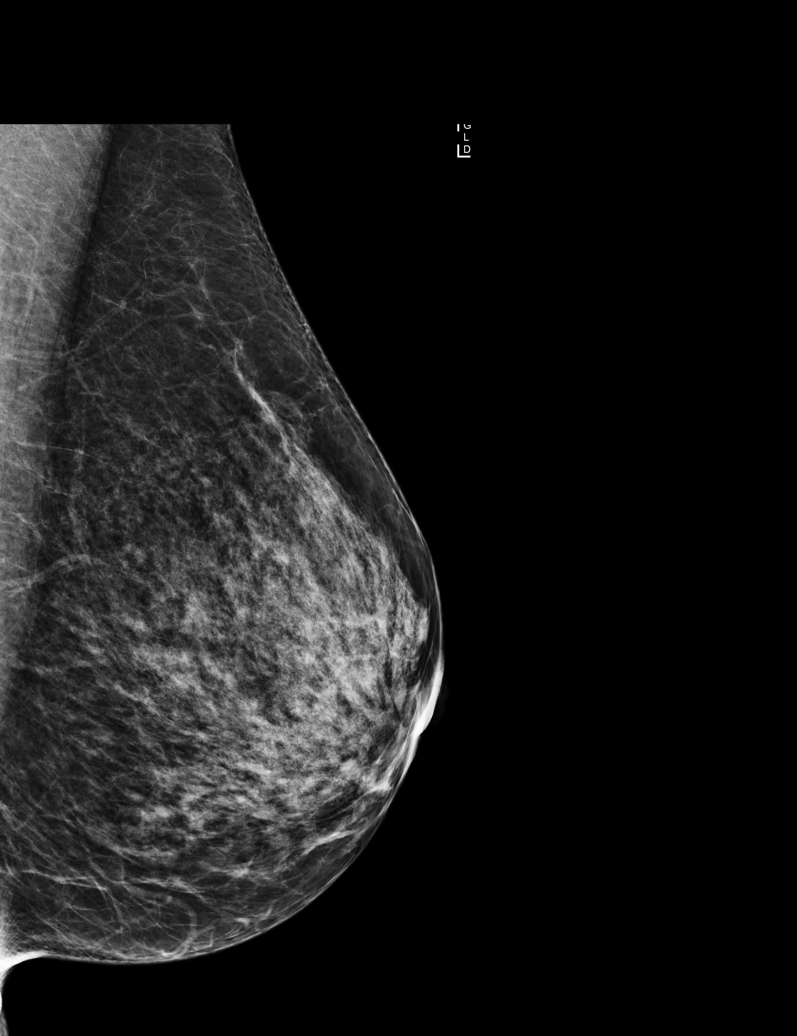

[R MLO]
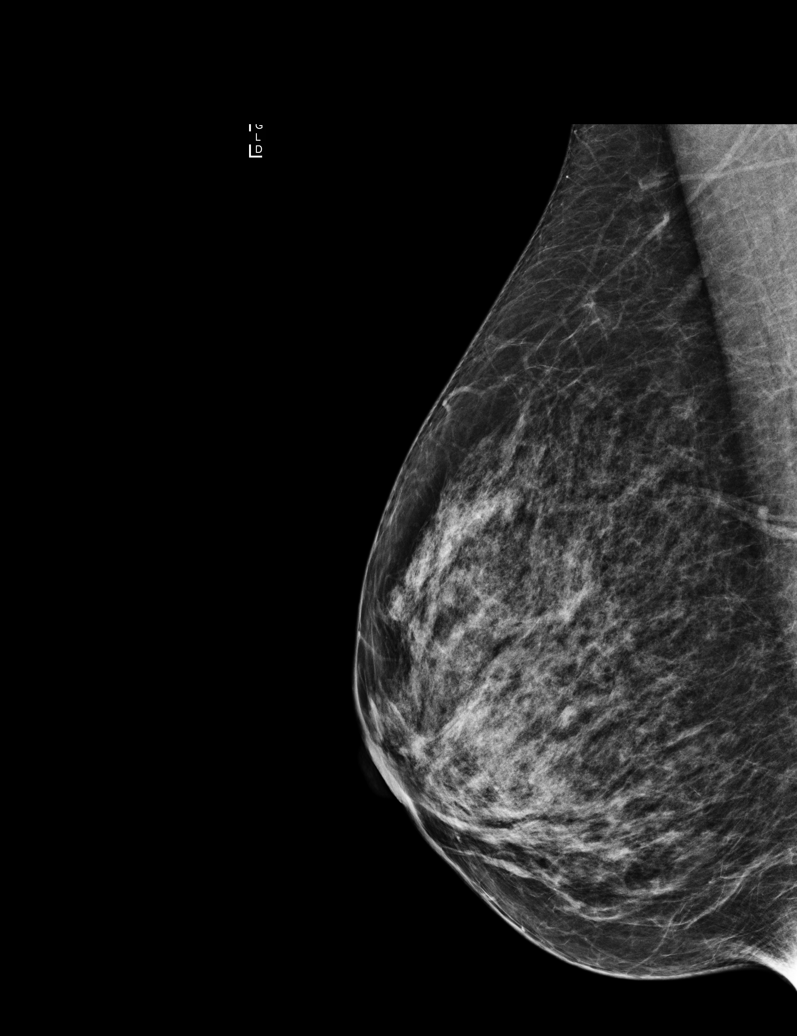

[R CC (2 of 2)]
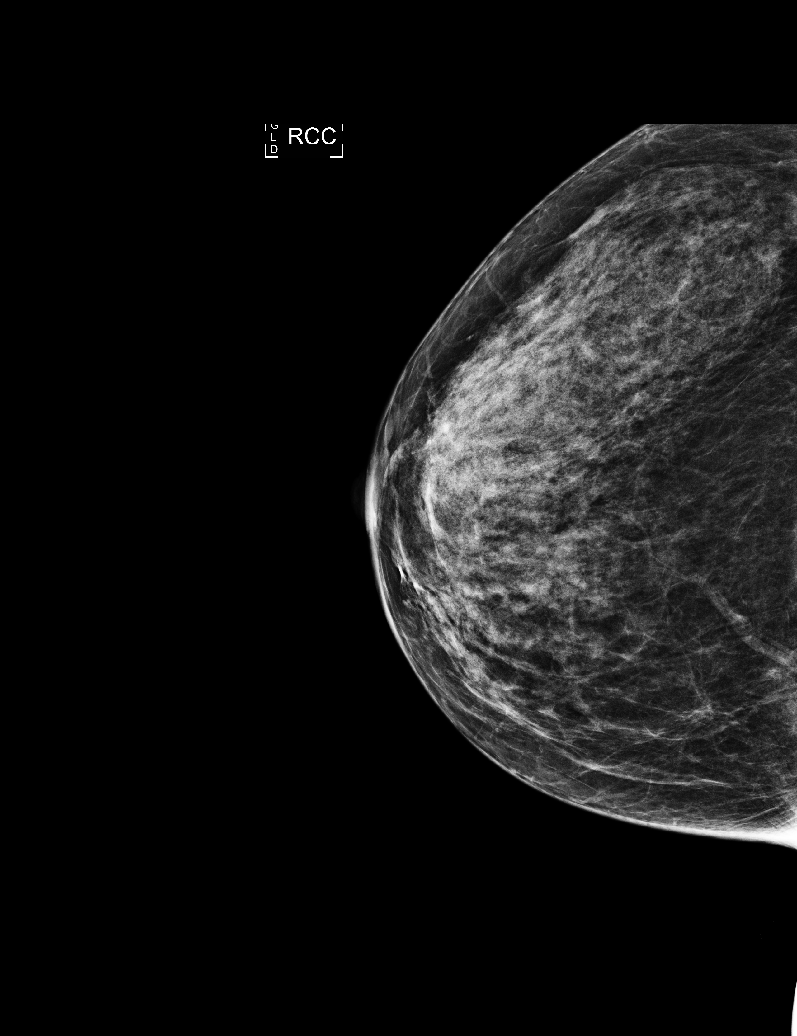

[5 of 5 positions shown; findings below may reference images not displayed]

ACR Breast Density Category c: The breast tissue is heterogeneously
dense, which may obscure small masses.
FINDINGS: There are no findings suspicious for malignancy. Images were
processed with CAD.
IMPRESSION: No mammographic evidence of malignancy. A result letter of this
screening mammogram will be mailed directly to the patient.

RECOMMENDATION:
Screening mammogram in one year. (Code:TN-[id number])

BI-RADS CATEGORY  1: Negative.

## 2017-02-17 ENCOUNTER — Other Ambulatory Visit: Payer: Self-pay | Admitting: Family Medicine

## 2017-03-18 ENCOUNTER — Inpatient Hospital Stay: Payer: Commercial Managed Care - PPO | Attending: Hematology and Oncology

## 2017-03-19 ENCOUNTER — Inpatient Hospital Stay: Payer: Commercial Managed Care - PPO

## 2017-03-19 ENCOUNTER — Inpatient Hospital Stay: Payer: Commercial Managed Care - PPO | Admitting: Hematology and Oncology

## 2017-03-19 NOTE — Progress Notes (Unsigned)
[facility]-  Cancer Center  Clinic day:  03/19/17  Chief Complaint: Lindsey Medina is an 56 y.o. female with hemochromatosis who is seen for 6 month assessment.  HPI:  The patient was last seen in the hematology clinic on 09/18/2016.  At that time, she was doing well.  Exam was unremarkable.  Ferritin was 77.  Folate was 10.3 with a B12 of 192 (low normal).  MMA on 09/18/2016 was 256 (normal).  Abdominal ultrasound on 09/23/2016 revealed mildly increased hepatic echotexture may reflect fatty infiltrative change or other hepatocellular disease (stable). There were no cirrhotic changes nor findings suspicious for hepatic masses  Labs on 11/05/2016 revealed a hematocrit of 36.9, hemoglobin 12.5, MCV 101.2, platelets 251,000, WBC 4500 with an ANC of 2800.  Ferritin was 68.  Symptomatically,    Past Medical History:  Diagnosis Date  . Allergy   . Anxiety   . Arthritis   . Bipolar disorder (HCC)   . Depression   . Hemochromatosis 01/03/2015  . Hemochromatosis 01/03/2015  . Hypercholesteremia   . Hypertension   . Irregular heart beats   . Raynaud phenomenon    No past surgical history on file.  Family History  Problem Relation Age of Onset  . Leukemia Other   No family history of hemochromatosis.  Social History:  reports that she quit smoking about 28 years ago. She quit after 15.00 years of use. She has never used smokeless tobacco. She reports that she drinks alcohol. She reports that she does not use drugs.  She drinks 1-2 glasses of wine occasionally.  She lives in [place]. Insurance changes on 11/10/2016.  The patient is alone today.  Allergies:  Allergies  Allergen Reactions  . Morphine Nausea And Vomiting    Current Medications: Current Outpatient Prescriptions  Medication Sig Dispense Refill  . buPROPion (WELLBUTRIN XL) 150 MG 24 hr tablet Take 300 mg by mouth daily. Dr Omelia Blackwater    . cetirizine (ZYRTEC) 10 MG tablet Take 1 tablet (10 mg  total) by mouth daily. 30 tablet 5  . clonazePAM (KLONOPIN) 2 MG tablet Take 2 mg by mouth as needed.     Marland Kitchen lisdexamfetamine (VYVANSE) 30 MG capsule Take 30 mg by mouth as needed.    Marland Kitchen MYDAYIS 37.5 MG CP24 Take 37.5 mg by mouth daily.    . QUEtiapine (SEROQUEL) 200 MG tablet Take 1 tablet by mouth at bedtime.    Marland Kitchen spironolactone (ALDACTONE) 50 MG tablet Take 50 mg by mouth 2 (two) times daily. derm    . tretinoin (RETIN-A) 0.025 % cream Apply to whole face at night.  Decrease frequency if too irritating.     No current facility-administered medications for this visit.    Review of Systems:  GENERAL:  Feels fine "getting older".  No fevers or sweats.  Weight loss of 1 pound. PERFORMANCE STATUS (ECOG):  1 HEENT:  No visual changes, runny nose, sore throat, mouth sores or tenderness. Lungs: No shortness of breath or cough.  No hemoptysis. Cardiac:  No chest pain, palpitations, orthopnea, or PND. GI:  Constipation.  No nausea, vomiting, diarrhea, melena or hematochezia. GU:  No urgency, frequency, dysuria, or hematuria.  Menopause at age 32. Musculoskeletal:  No unusual aches or arthritis pain.  No muscle tenderness. Extremities:  No pain or swelling. Skin:  Using tanning lotion.  No rashes or skin changes. Neuro:  No headache, numbness or weakness, balance or coordination issues. Endocrine:  No diabetes, thyroid issues, hot flashes or  night sweats. Psych:  No mood changes, depression or anxiety. Review of systems:  All other systems reviewed and found to be negative.   Physical Exam: There were no vitals taken for this visit. GENERAL:  Well developed, well nourished, woman sitting comfortably in the exam room in no acute distress. MENTAL STATUS:  Alert and oriented to person, place and time. HEAD:  Brown hair.  Normocephalic, atraumatic, face symmetric, no Cushingoid features. EYES:  Brown eyes.  Pupils equal round and reactive to light and accomodation.  No conjunctivitis or scleral  icterus. ENT:  Oropharynx clear without lesion.  Tongue normal. Mucous membranes moist.  RESPIRATORY:  Clear to auscultation without rales, wheezes or rhonchi. CARDIOVASCULAR:  Regular rate and rhythm without murmur, rub or gallop. ABDOMEN:  Soft, non-tender, with active bowel sounds, and no hepatosplenomegaly.  No masses. SKIN:  Tan.  No rashes, ulcers or lesions. EXTREMITIES: No edema, no skin discoloration or tenderness.  No palpable cords. LYMPH NODES: No palpable cervical, supraclavicular, axillary or inguinal adenopathy  NEUROLOGICAL: Unremarkable. PSYCH:  Appropriate.    No visits with results within 3 Day(s) from this visit.  Latest known visit with results is:  Appointment on 11/05/2016  Component Date Value Ref Range Status  . Ferritin 11/05/2016 68  11 - 307 ng/mL Final  . WBC 11/05/2016 4.5  3.6 - 11.0 K/uL Final  . RBC 11/05/2016 3.65* 3.80 - 5.20 MIL/uL Final  . Hemoglobin 11/05/2016 12.5  12.0 - 16.0 g/dL Final  . HCT 40/98/119103/27/2018 36.9  35.0 - 47.0 % Final  . MCV 11/05/2016 101.2* 80.0 - 100.0 fL Final  . MCH 11/05/2016 34.3* 26.0 - 34.0 pg Final  . MCHC 11/05/2016 33.9  32.0 - 36.0 g/dL Final  . RDW 47/82/956203/27/2018 12.1  11.5 - 14.5 % Final  . Platelets 11/05/2016 251  150 - 440 K/uL Final  . Neutrophils Relative % 11/05/2016 61  % Final  . Neutro Abs 11/05/2016 2.8  1.4 - 6.5 K/uL Final  . Lymphocytes Relative 11/05/2016 28  % Final  . Lymphs Abs 11/05/2016 1.3  1.0 - 3.6 K/uL Final  . Monocytes Relative 11/05/2016 9  % Final  . Monocytes Absolute 11/05/2016 0.4  0.2 - 0.9 K/uL Final  . Eosinophils Relative 11/05/2016 1  % Final  . Eosinophils Absolute 11/05/2016 0.0  0 - 0.7 K/uL Final  . Basophils Relative 11/05/2016 1  % Final  . Basophils Absolute 11/05/2016 0.0  0 - 0.1 K/uL Final   Assessment:  Lindsey Medina is an 56 y.o. female with hemochromatosis (homozygous H63D).  She undergoes periodic phlebotomy to maintain a ferritin < 50-100.  Last phlebotomy was on  03/27/2016 (300 cc).  She experienced a presyncopal event on 12/28/2013 (volume 350 cc).  Alpha-fetoprotein was 4 (0-8.3) on 09/20/2015, 5.4 on 03/20/2016, and 4.4 on 09/18/2016.    Abdominal ultrasound on 03/25/2016 revealed slight increase echotexture throughout the liver without focal abnormality.  Abdominal ultrasound on 09/23/2016 revealed mildly increased hepatic echotexture may reflect fatty infiltrative change or other hepatocellular disease (stable).  She has a history of leukopenia and macrocytic RBC indices.  She denies any liver disease.  Work-up on 10/10/2010 included a normal B12, folic acid, ANA, rheumatoid factor, serum protein electrophoresis, and flow cytometry.  Hepatitis B core antibody total, hepatitis B surface antigen, and hepatitis C antibody were negative on 09/20/2015.    Symptomatically, she feels good except for "getting older".  Exam is unremarkable.  Plan: 1. Labs today:  CBC with  diff, CMP, ferritin, AFP, B12. 2.  3. Review labs from yesterday.  Discuss low normal B12 and checking for B12 deficiency (MMA). 4. Review ferritin goal.  Continue small volume phlebotomies (250-300 cc) if ferritin > 50-100.  Suspect will need phlebotomy within 3 months. 5. Review last liver ultrasound.  Continue check every 6 months. 6. RUQ ultrasound on 03/23/2017. 7. RTC in 6 months for MD assessment,  labs (CBC with diff, CMP, ferritin, AFP- labs day before) and +/- phlebotomy.   Rosey Bath, MD  03/19/2017, 5:28 AM

## 2017-05-07 ENCOUNTER — Ambulatory Visit
Admission: EM | Admit: 2017-05-07 | Discharge: 2017-05-07 | Disposition: A | Discharge status: Home / Self Care | Payer: Commercial Managed Care - PPO | Attending: Family Medicine | Admitting: Family Medicine

## 2017-05-07 ENCOUNTER — Encounter: Payer: Self-pay | Admitting: *Deleted

## 2017-05-07 ENCOUNTER — Telehealth: Payer: Self-pay

## 2017-05-07 DIAGNOSIS — J0100 Acute maxillary sinusitis, unspecified: Secondary | ICD-10-CM | POA: Diagnosis not present

## 2017-05-07 MED ORDER — BENZONATATE 200 MG PO CAPS: 200.0000 mg | ORAL_CAPSULE | Freq: Three times a day (TID) | ORAL | 0 refills | Status: DC | PRN

## 2017-05-07 MED ORDER — AMOXICILLIN 875 MG PO TABS: 875.0000 mg | ORAL_TABLET | Freq: Two times a day (BID) | ORAL | 0 refills | Status: DC

## 2017-05-07 NOTE — ED Provider Notes (Signed)
[facility]    CSN: [id number] Arrival date & time: 05/07/17  1251     History   Chief Complaint Chief Complaint  Patient presents with  . Cough  . Headache  . Generalized Body Aches    HPI Lindsey Medina is a 56 y.o. female.   The history is provided by the patient.  Cough  Associated symptoms: headaches   Headache  Associated symptoms: congestion, cough, facial pain and URI   URI  Presenting symptoms: congestion, cough and facial pain   Severity:  Moderate Onset quality:  Sudden Duration:  6 days Timing:  Constant Progression:  Worsening Chronicity:  New Relieved by:  None tried Ineffective treatments:  None tried Associated symptoms: headaches   Risk factors: not elderly, no chronic cardiac disease, no chronic kidney disease, no chronic respiratory disease, no diabetes mellitus, no immunosuppression, no recent illness, no recent travel and no sick contacts     Past Medical History:  Diagnosis Date  . Allergy   . Anxiety   . Arthritis   . Bipolar disorder (HCC)   . Depression   . Hemochromatosis 01/03/2015  . Hemochromatosis 01/03/2015  . Hypercholesteremia   . Hypertension   . Irregular heart beats   . Raynaud phenomenon     Patient Active Problem List   Diagnosis Date Noted  . Macrocytosis 09/18/2016  . Hemochromatosis 01/03/2015    History reviewed. No pertinent surgical history.  OB History    No data available       Home Medications    Prior to Admission medications   Medication Sig Start Date End Date Taking? Authorizing Provider  buPROPion (WELLBUTRIN XL) 150 MG 24 hr tablet Take 300 mg by mouth daily. Dr Omelia Blackwater   Yes [provider]  cetirizine (ZYRTEC) 10 MG tablet Take 1 tablet (10 mg total) by mouth daily. 10/27/15  Yes Duanne Limerick, MD  clonazePAM (KLONOPIN) 2 MG tablet Take 2 mg by mouth as needed.  08/07/14  Yes [provider]  MYDAYIS 37.5 MG CP24 Take 37.5 mg by mouth daily. 09/12/16  Yes  [provider]  QUEtiapine (SEROQUEL) 200 MG tablet Take 1 tablet by mouth at bedtime. 06/27/15  Yes [provider]  spironolactone (ALDACTONE) 50 MG tablet Take 50 mg by mouth 2 (two) times daily. derm   Yes [provider]  amoxicillin (AMOXIL) 875 MG tablet Take 1 tablet (875 mg total) by mouth 2 (two) times daily. 05/07/17   Payton Mccallum, MD  benzonatate (TESSALON) 200 MG capsule Take 1 capsule (200 mg total) by mouth 3 (three) times daily as needed. 05/07/17   Payton Mccallum, MD  lisdexamfetamine (VYVANSE) 30 MG capsule Take 30 mg by mouth as needed. 07/21/15   [provider]  tretinoin (RETIN-A) 0.025 % cream Apply to whole face at night.  Decrease frequency if too irritating. 09/30/14   [provider]    Family History Family History  Problem Relation Age of Onset  . Leukemia Other     Social History Social History  Substance Use Topics  . Smoking status: Former Smoker    Years: 15.00    Quit date: 12/06/1988  . Smokeless tobacco: Never Used  . Alcohol use Yes     Comment: Socially wine     Allergies   Morphine   Review of Systems Review of Systems  HENT: Positive for congestion.   Respiratory: Positive for cough.   Neurological: Positive for headaches.  Physical Exam Triage Vital Signs ED Triage Vitals  Enc Vitals Group     BP 05/07/17 1311 125/82     Pulse Rate 05/07/17 1311 96     Resp 05/07/17 1311 16     Temp 05/07/17 1311 98.3 F (36.8 C)     Temp Source 05/07/17 1311 Oral     SpO2 05/07/17 1311 100 %     Weight 05/07/17 1313 122 lb (55.3 kg)     Height 05/07/17 1313  (1.626 m)     Head Circumference --      Peak Flow --      Pain Score 05/07/17 1356 0     Pain Loc --      Pain Edu? --      Excl. in GC? --    No data found.   Updated Vital Signs BP 125/82 (BP Location: Left Arm)   Pulse 96   Temp 98.3 F (36.8 C) (Oral)   Resp 16   Ht  (1.626 m)   Wt 122 lb (55.3 kg)   SpO2  100%   BMI 20.94 kg/m   Visual Acuity Right Eye Distance:   Left Eye Distance:   Bilateral Distance:    Right Eye Near:   Left Eye Near:    Bilateral Near:     Physical Exam  Constitutional: She appears well-developed and well-nourished. No distress.  HENT:  Head: Normocephalic and atraumatic.  Right Ear: Tympanic membrane, external ear and ear canal normal.  Left Ear: Tympanic membrane, external ear and ear canal normal.  Nose: Mucosal edema and rhinorrhea present. No nose lacerations, sinus tenderness, nasal deformity, septal deviation or nasal septal hematoma. No epistaxis.  No foreign bodies. Right sinus exhibits maxillary sinus tenderness and frontal sinus tenderness. Left sinus exhibits maxillary sinus tenderness and frontal sinus tenderness.  Mouth/Throat: Uvula is midline, oropharynx is clear and moist and mucous membranes are normal. No oropharyngeal exudate.  Eyes: Pupils are equal, round, and reactive to light. Conjunctivae and EOM are normal. Right eye exhibits no discharge. Left eye exhibits no discharge. No scleral icterus.  Neck: Normal range of motion. Neck supple. No thyromegaly present.  Cardiovascular: Normal rate, regular rhythm and normal heart sounds.   Pulmonary/Chest: Effort normal and breath sounds normal. No respiratory distress. She has no wheezes. She has no rales.  Lymphadenopathy:    She has no cervical adenopathy.  Skin: She is not diaphoretic.  Nursing note and vitals reviewed.    UC Treatments / Results  Labs (all labs ordered are listed, but only abnormal results are displayed) Labs Reviewed - No data to display  EKG  EKG Interpretation None       Radiology No results found.  Procedures Procedures (including critical care time)  Medications Ordered in UC Medications - No data to display   Initial Impression / Assessment and Plan / UC Course  I have reviewed the triage vital signs and the nursing notes.  Pertinent labs &  imaging results that were available during my care of the patient were reviewed by me and considered in my medical decision making (see chart for details).       Final Clinical Impressions(s) / UC Diagnoses   Final diagnoses:  Acute maxillary sinusitis, recurrence not specified    New Prescriptions Discharge Medication List as of 05/07/2017  1:54 PM    START taking these medications   Details  amoxicillin (AMOXIL) 875 MG tablet Take 1 tablet (875 mg total) by  mouth 2 (two) times daily., Starting Wed 05/07/2017, Normal    benzonatate (TESSALON) 200 MG capsule Take 1 capsule (200 mg total) by mouth 3 (three) times daily as needed., Starting Wed 05/07/2017, Normal       1. diagnosis reviewed with patient 2. rx as per orders above; reviewed possible side effects, interactions, risks and benefits  3. Recommend supportive treatment with otc analgesics 4. Follow-up prn if symptoms worsen or don't improve  Controlled Substance Prescriptions  Controlled Substance Registry consulted? Not Applicable   Payton Mccallum, MD 05/07/17 [phone]

## 2017-05-07 NOTE — ED Triage Notes (Signed)
Productive cough- green, chest congestion, headache, body aches x6 days. OTC meds not working.

## 2017-06-04 ENCOUNTER — Ambulatory Visit: Payer: Commercial Managed Care - PPO | Admitting: Unknown Physician Specialty

## 2018-01-21 ENCOUNTER — Inpatient Hospital Stay: Payer: Commercial Managed Care - PPO | Admitting: Hematology and Oncology

## 2018-01-21 ENCOUNTER — Inpatient Hospital Stay: Payer: Commercial Managed Care - PPO

## 2018-02-19 ENCOUNTER — Ambulatory Visit
Admission: EM | Admit: 2018-02-19 | Discharge: 2018-02-19 | Disposition: A | Discharge status: Home / Self Care | Payer: Commercial Managed Care - PPO | Attending: Family Medicine | Admitting: Family Medicine

## 2018-02-19 ENCOUNTER — Other Ambulatory Visit: Payer: Self-pay

## 2018-02-19 DIAGNOSIS — J029 Acute pharyngitis, unspecified: Secondary | ICD-10-CM | POA: Diagnosis not present

## 2018-02-19 DIAGNOSIS — B9789 Other viral agents as the cause of diseases classified elsewhere: Secondary | ICD-10-CM | POA: Diagnosis not present

## 2018-02-19 LAB — RAPID STREP SCREEN (MED CTR MEBANE ONLY): Streptococcus, Group A Screen (Direct): NEGATIVE

## 2018-02-19 MED ORDER — LIDOCAINE VISCOUS HCL 2 % MT SOLN: 15.0000 mL | OROMUCOSAL | 0 refills | Status: DC | PRN

## 2018-02-19 NOTE — Discharge Instructions (Signed)
Rest.  Ibuprofen as needed.  Use the prescription as directed.  Take care  Dr. Adriana Simasook

## 2018-02-19 NOTE — ED Triage Notes (Signed)
Patient complains of sore throat that she has been having sore throat since Monday. Hurts to talk or swallow.

## 2018-02-19 NOTE — ED Provider Notes (Signed)
[facility]   CSN: [id number] Arrival date & time: 02/19/18  1240  History   Chief Complaint Chief Complaint  Patient presents with  . Sore Throat   HPI  57 year old female presents with sore throat.  Sore throat  Patient reports sore throat since Monday.  She denies any other upper respiratory symptoms.  She is been exposed to her grandchildren have been sick.  She has taken over-the-counter pain medication without resolution.  Worse with talking and swallowing.  No relieving factors.  No other associated symptoms.  No other complaints at this time.  Past Medical History:  Diagnosis Date  . Allergy   . Anxiety   . Arthritis   . Bipolar disorder (HCC)   . Depression   . Hemochromatosis 01/03/2015  . Hemochromatosis 01/03/2015  . Hypercholesteremia   . Hypertension   . Irregular heart beats   . Raynaud phenomenon     Patient Active Problem List   Diagnosis Date Noted  . Macrocytosis 09/18/2016  . Hemochromatosis 01/03/2015    Past Surgical History:  Procedure Laterality Date  . NO PAST SURGERIES      OB History   None      Home Medications    Prior to Admission medications   Medication Sig Start Date End Date Taking? Authorizing Provider  buPROPion (WELLBUTRIN XL) 150 MG 24 hr tablet Take 300 mg by mouth daily. Dr Omelia BlackwaterHeaden   Yes [provider]  cetirizine (ZYRTEC) 10 MG tablet Take 1 tablet (10 mg total) by mouth daily. 10/27/15  Yes Duanne LimerickJones, Deanna C, MD  MYDAYIS 37.5 MG CP24 Take 37.5 mg by mouth daily. 09/12/16  Yes [provider]  spironolactone (ALDACTONE) 50 MG tablet Take 50 mg by mouth 2 (two) times daily. derm   Yes [provider]  lidocaine (XYLOCAINE) 2 % solution Use as directed 15 mLs in the mouth or throat every 4 (four) hours as needed for mouth pain. 02/19/18   Tommie Samsook, Emersyn Kotarski G, DO    Family History Family History  Problem Relation Age of Onset  . Leukemia Other   . Diabetes Father     Social  History Social History   Tobacco Use  . Smoking status: Former Smoker    Years: 15.00    Last attempt to quit: 12/06/1988    Years since quitting: 29.2  . Smokeless tobacco: Never Used  Substance Use Topics  . Alcohol use: Yes    Comment: Socially wine  . Drug use: No     Allergies   Morphine   Review of Systems Review of Systems  Constitutional: Negative.   HENT: Positive for sore throat.    Physical Exam Triage Vital Signs ED Triage Vitals  Enc Vitals Group     BP 02/19/18 1254 128/80     Pulse Rate 02/19/18 1254 85     Resp 02/19/18 1254 18     Temp 02/19/18 1254 97.7 F (36.5 C)     Temp Source 02/19/18 1254 Oral     SpO2 02/19/18 1254 100 %     Weight 02/19/18 1253 120 lb (54.4 kg)     Height 02/19/18 1253 5\' 4"  (1.626 m)     Head Circumference --      Peak Flow --      Pain Score 02/19/18 1253 6     Pain Loc --      Pain Edu? --      Excl. in GC? --    Updated Vital  Signs BP 128/80 (BP Location: Left Arm)   Pulse 85   Temp 97.7 F (36.5 C) (Oral)   Resp 18   Ht 5\' 4"  (1.626 m)   Wt 120 lb (54.4 kg)   SpO2 100%   BMI 20.60 kg/m   Physical Exam  Constitutional: She is oriented to person, place, and time. She appears well-developed. No distress.  HENT:  Head: Normocephalic and atraumatic.  Oropharynx with moderate erythema.  Cobblestoning noted.  No exudate.  Cardiovascular: Normal rate and regular rhythm.  Pulmonary/Chest: Effort normal and breath sounds normal. She has no wheezes. She has no rales.  Neurological: She is alert and oriented to person, place, and time.  Psychiatric: She has a normal mood and affect. Her behavior is normal.  Nursing note and vitals reviewed.  UC Treatments / Results  Labs (all labs ordered are listed, but only abnormal results are displayed) Labs Reviewed  RAPID STREP SCREEN ([facility] ONLY)  CULTURE, GROUP A STREP [facility])    EKG None  Radiology No results found.  Procedures Procedures (including  critical care time)  Medications Ordered in UC Medications - No data to display  Initial Impression / Assessment and Plan / UC Course  I have reviewed the triage vital signs and the nursing notes.  Pertinent labs & imaging results that were available during my care of the patient were reviewed by me and considered in my medical decision making (see chart for details).    57 year old female presents with viral pharyngitis.  Strep negative.  Viscous lidocaine as needed.  Final Clinical Impressions(s) / UC Diagnoses   Final diagnoses:  Viral pharyngitis     Discharge Instructions     Rest.  Ibuprofen as needed.  Use the prescription as directed.  Take care  Dr. Adriana Simas    ED Prescriptions    Medication Sig Dispense Auth. Provider   lidocaine (XYLOCAINE) 2 % solution Use as directed 15 mLs in the mouth or throat every 4 (four) hours as needed for mouth pain. 200 mL Tommie Sams, DO     Controlled Substance Prescriptions [place] Controlled Substance Registry consulted? Not Applicable   Tommie Sams, DO 02/19/18 1422

## 2018-02-22 LAB — CULTURE, GROUP A STREP (THRC)

## 2018-03-18 ENCOUNTER — Encounter: Payer: Self-pay | Admitting: Hematology and Oncology

## 2018-03-18 ENCOUNTER — Inpatient Hospital Stay: Payer: Commercial Managed Care - PPO | Attending: Hematology and Oncology | Admitting: Hematology and Oncology

## 2018-03-18 ENCOUNTER — Other Ambulatory Visit: Payer: Self-pay | Admitting: Hematology and Oncology

## 2018-03-18 ENCOUNTER — Inpatient Hospital Stay: Payer: Commercial Managed Care - PPO

## 2018-03-18 DIAGNOSIS — Z79899 Other long term (current) drug therapy: Secondary | ICD-10-CM | POA: Diagnosis not present

## 2018-03-18 DIAGNOSIS — I10 Essential (primary) hypertension: Secondary | ICD-10-CM | POA: Diagnosis not present

## 2018-03-18 DIAGNOSIS — F319 Bipolar disorder, unspecified: Secondary | ICD-10-CM | POA: Diagnosis not present

## 2018-03-18 DIAGNOSIS — F419 Anxiety disorder, unspecified: Secondary | ICD-10-CM | POA: Diagnosis not present

## 2018-03-18 DIAGNOSIS — R7989 Other specified abnormal findings of blood chemistry: Secondary | ICD-10-CM

## 2018-03-18 DIAGNOSIS — E538 Deficiency of other specified B group vitamins: Secondary | ICD-10-CM

## 2018-03-18 DIAGNOSIS — M1990 Unspecified osteoarthritis, unspecified site: Secondary | ICD-10-CM | POA: Insufficient documentation

## 2018-03-18 DIAGNOSIS — E7800 Pure hypercholesterolemia, unspecified: Secondary | ICD-10-CM | POA: Diagnosis not present

## 2018-03-18 DIAGNOSIS — Z87891 Personal history of nicotine dependence: Secondary | ICD-10-CM | POA: Insufficient documentation

## 2018-03-18 DIAGNOSIS — I7300 Raynaud's syndrome without gangrene: Secondary | ICD-10-CM | POA: Insufficient documentation

## 2018-03-18 LAB — CBC WITH DIFFERENTIAL/PLATELET
Basophils Absolute: 0 10*3/uL (ref 0–0.1)
Basophils Relative: 1 %
Eosinophils Absolute: 0.1 10*3/uL (ref 0–0.7)
Eosinophils Relative: 1 %
HCT: 37.8 % (ref 35.0–47.0)
Hemoglobin: 13 g/dL (ref 12.0–16.0)
Lymphocytes Relative: 36 %
Lymphs Abs: 1.5 10*3/uL (ref 1.0–3.6)
MCH: 35.2 pg — ABNORMAL HIGH (ref 26.0–34.0)
MCHC: 34.3 g/dL (ref 32.0–36.0)
MCV: 102.6 fL — ABNORMAL HIGH (ref 80.0–100.0)
Monocytes Absolute: 0.3 10*3/uL (ref 0.2–0.9)
Monocytes Relative: 8 %
Neutro Abs: 2.2 10*3/uL (ref 1.4–6.5)
Neutrophils Relative %: 54 %
Platelets: 279 10*3/uL (ref 150–440)
RBC: 3.69 MIL/uL — ABNORMAL LOW (ref 3.80–5.20)
RDW: 12 % (ref 11.5–14.5)
WBC: 4.1 10*3/uL (ref 3.6–11.0)

## 2018-03-18 LAB — COMPREHENSIVE METABOLIC PANEL
ALT: 15 U/L (ref 0–44)
AST: 24 U/L (ref 15–41)
Albumin: 4.9 g/dL (ref 3.5–5.0)
Alkaline Phosphatase: 49 U/L (ref 38–126)
Anion gap: 13 (ref 5–15)
BUN: 13 mg/dL (ref 6–20)
CO2: 26 mmol/L (ref 22–32)
Calcium: 9.4 mg/dL (ref 8.9–10.3)
Chloride: 102 mmol/L (ref 98–111)
Creatinine, Ser: 0.89 mg/dL (ref 0.44–1.00)
GFR calc Af Amer: >60 mL/min (ref >60)
GFR calc non Af Amer: >60 mL/min (ref >60)
Glucose, Bld: 152 mg/dL — ABNORMAL HIGH (ref 70–99)
Potassium: 3.7 mmol/L (ref 3.5–5.1)
Sodium: 141 mmol/L (ref 135–145)
Total Bilirubin: 0.6 mg/dL (ref 0.3–1.2)
Total Protein: 7.3 g/dL (ref 6.5–8.1)

## 2018-03-18 NOTE — Progress Notes (Signed)
[facility] day:  03/18/18  Chief Complaint: Lindsey Medina is an 57 y.o. female with hemochromatosis who is seen for 18 month assessment.  HPI:  The patient was last seen in the hematology clinic on 09/18/2016.  At that time, she felt good except for "getting older".  Exam was unremarkable.  Hematocrit was 36.9, hemoglobin 12.5, and MCV 101.2.  Ferritin was 68.  Abdominal ultrasound on 09/23/2016 revealed mildly increased hepatic echotexture may reflect fatty infiltrative change or other hepatocellular disease (stable). There was no cirrhotic changes nor findings suspicious for hepatic masses.  Gallbladder and common bile duct were normal.  Last phlebotomy was 11/06/2016.  She was lost to follow-up.  During the interim, patient is doing "alright". Patient notes that she "still loses circulation" in her hands. Patient describes that her hands turn "white and purple". She has a past medical history significant for Raynaud's. She has been fatigued and exertionally dyspneic. Patient's appetite remains stable. She has lost 7 pounds since her last visit. Patient states, "it is because I had to wear those Invisalign things for 22 hours a day for 8 months. It made it hard to eat".   Patient denies that she has experienced any B symptoms. She denies any interval infections. Patient denies palmar-plantar erythrodysesthesia, neuropathy, and cold sensitivity. She has not experienced any chest pain or palpitations.   She denies pain in the clinic today.    Past Medical History:  Diagnosis Date  . Allergy   . Anxiety   . Arthritis   . Bipolar disorder ([place])   . Depression   . Hemochromatosis 01/03/2015  . Hemochromatosis 01/03/2015  . Hypercholesteremia   . Hypertension   . Irregular heart beats   . Raynaud phenomenon    Past Surgical History:  Procedure Laterality Date  . NO PAST SURGERIES      Family History  Problem Relation Age of Onset   . Leukemia Other   . Diabetes Father   No family history of hemochromatosis.  Social History:  reports that she quit smoking about 29 years ago. She quit after 15.00 years of use. She has never used smokeless tobacco. She reports that she drinks alcohol. She reports that she does not use drugs.  She drinks 1-2 glasses of wine occasionally.  She lives in [place]. Insurance changes on 11/10/2016.  The patient is alone today.  Allergies:  Allergies  Allergen Reactions  . Morphine Nausea And Vomiting    Current Medications: Current Outpatient Medications  Medication Sig Dispense Refill  . buPROPion (WELLBUTRIN XL) 150 MG 24 hr tablet Take 300 mg by mouth daily. Dr Rosine Door    . cetirizine (ZYRTEC) 10 MG tablet Take 1 tablet (10 mg total) by mouth daily. 30 tablet 5  . MYDAYIS 37.5 MG CP24 Take 37.5 mg by mouth daily.    . QUEtiapine (SEROQUEL) 300 MG tablet Take 300 mg by mouth daily.    Marland Kitchen spironolactone (ALDACTONE) 50 MG tablet Take 50 mg by mouth 2 (two) times daily. derm     No current facility-administered medications for this visit.     Review of Systems  Constitutional: Positive for malaise/fatigue and weight loss (down 7 pounds). Negative for diaphoresis and fever.       "I feel alright".  HENT: Negative.   Eyes: Negative.   Respiratory: Positive for shortness of breath (exertional). Negative for cough, hemoptysis and sputum production.   Cardiovascular: Negative for chest pain, palpitations, orthopnea, leg  swelling and PND.  Gastrointestinal: Negative for abdominal pain, blood in stool, constipation, diarrhea, melena, nausea and vomiting.  Genitourinary: Negative for dysuria, frequency, hematuria and urgency.  Musculoskeletal: Negative for back pain, falls, joint pain and myalgias.  Skin: Negative for itching and rash.  Neurological: Negative for dizziness, tremors, weakness and headaches.  Endo/Heme/Allergies: Does not bruise/bleed easily.  Psychiatric/Behavioral: Negative  for depression, memory loss and suicidal ideas. The patient is not nervous/anxious and does not have insomnia.   All other systems reviewed and are negative.  Performance status (ECOG): 1 - Symptomatic but completely ambulatory  Vital Signs BP 122/80 (BP Location: Left Arm, Patient Position: Sitting)   Pulse 84   Temp 97.9 F (36.6 C) (Tympanic)   Resp 18   Wt 121 lb 11.1 oz (55.2 kg)   BMI 20.89 kg/m   Physical Exam  Constitutional: She is oriented to person, place, and time and well-developed, well-nourished, and in no distress.  HENT:  Head: Normocephalic and atraumatic.  Brown hair  Eyes: Pupils are equal, round, and reactive to light. EOM are normal. No scleral icterus.  Brown eyes  Neck: Normal range of motion. Neck supple. No tracheal deviation present. No thyromegaly present.  Cardiovascular: Normal rate, regular rhythm and normal heart sounds. Exam reveals no gallop and no friction rub.  No murmur heard. Pulmonary/Chest: Effort normal and breath sounds normal. No respiratory distress. She has no wheezes. She has no rales.  Abdominal: Soft. Bowel sounds are normal. She exhibits no distension. There is no tenderness.  Musculoskeletal: Normal range of motion. She exhibits no edema or tenderness.  Lymphadenopathy:    She has no cervical adenopathy.    She has no axillary adenopathy.       Right: No inguinal and no supraclavicular adenopathy present.       Left: No inguinal and no supraclavicular adenopathy present.  Neurological: She is alert and oriented to person, place, and time.  Skin: Skin is warm and dry. No rash noted. No erythema.  Psychiatric: Mood, affect and judgment normal.  Nursing note and vitals reviewed.   Appointment on 03/18/2018  Component Date Value Ref Range Status  . Sodium 03/18/2018 141  135 - 145 mmol/L Final  . Potassium 03/18/2018 3.7  3.5 - 5.1 mmol/L Final  . Chloride 03/18/2018 102  98 - 111 mmol/L Final  . CO2 03/18/2018 26  22 - 32  mmol/L Final  . Glucose, Bld 03/18/2018 152* 70 - 99 mg/dL Final  . BUN 03/18/2018 13  6 - 20 mg/dL Final  . Creatinine, Ser 03/18/2018 0.89  0.44 - 1.00 mg/dL Final  . Calcium 03/18/2018 9.4  8.9 - 10.3 mg/dL Final  . Total Protein 03/18/2018 7.3  6.5 - 8.1 g/dL Final  . Albumin 03/18/2018 4.9  3.5 - 5.0 g/dL Final  . AST 03/18/2018 24  15 - 41 U/L Final  . ALT 03/18/2018 15  0 - 44 U/L Final  . Alkaline Phosphatase 03/18/2018 49  38 - 126 U/L Final  . Total Bilirubin 03/18/2018 0.6  0.3 - 1.2 mg/dL Final  . GFR calc non Af Amer 03/18/2018 >60  >60 mL/min Final  . GFR calc Af Amer 03/18/2018 >60  >60 mL/min Final   Comment: (NOTE) The eGFR has been calculated using the CKD EPI equation. This calculation has not been validated in all clinical situations. eGFR's persistently <60 mL/min signify possible Chronic Kidney Disease.   . Anion gap 03/18/2018 13  5 - 15 Final  Performed at [facility], [address] [place], [place] [address]  . WBC 03/18/2018 4.1  3.6 - 11.0 K/uL Final  . RBC 03/18/2018 3.69* 3.80 - 5.20 MIL/uL Final  . Hemoglobin 03/18/2018 13.0  12.0 - 16.0 g/dL Final  . HCT 03/18/2018 37.8  35.0 - 47.0 % Final  . MCV 03/18/2018 102.6* 80.0 - 100.0 fL Final  . MCH 03/18/2018 35.2* 26.0 - 34.0 pg Final  . MCHC 03/18/2018 34.3  32.0 - 36.0 g/dL Final  . RDW 03/18/2018 12.0  11.5 - 14.5 % Final  . Platelets 03/18/2018 279  150 - 440 K/uL Final  . Neutrophils Relative % 03/18/2018 54  % Final  . Neutro Abs 03/18/2018 2.2  1.4 - 6.5 K/uL Final  . Lymphocytes Relative 03/18/2018 36  % Final  . Lymphs Abs 03/18/2018 1.5  1.0 - 3.6 K/uL Final  . Monocytes Relative 03/18/2018 8  % Final  . Monocytes Absolute 03/18/2018 0.3  0.2 - 0.9 K/uL Final  . Eosinophils Relative 03/18/2018 1  % Final  . Eosinophils Absolute 03/18/2018 0.1  0 - 0.7 K/uL Final  . Basophils Relative 03/18/2018 1  % Final  . Basophils Absolute 03/18/2018 0.0  0 - 0.1 K/uL Final   Performed  at [facility], [address] [place], [place] [address]    Assessment:  Lindsey Medina is an 57 y.o. female with hemochromatosis (homozygous H63D).  She undergoes periodic phlebotomy to maintain a ferritin less than 50-100.  Last phlebotomy was on 11/06/2016 (250 cc).  She experienced a presyncopal event on 12/28/2013 (volume 350 cc).  Alpha-fetoprotein was 4 (0-8.3) on 09/20/2015, 5.4 on 03/20/2016, 4.4 on 09/18/2016, and 4.5 on 03/18/2018.    Abdominal ultrasound on 03/25/2016 revealed slight increase echotexture throughout the liver without focal abnormality.  Abdominal ultrasound on 09/23/2016 revealed mildly increased hepatic echotexture may reflect fatty infiltrative change or other hepatocellular disease (stable). There was no cirrhotic changes nor findings suspicious for hepatic masses.  Ferritin has been followed: 156 on 11/09/2013, 145 on 12/28/2013, 114 on 01/04/2015, 137 on 09/20/2015, 94 on 03/20/2016, 77 on 09/17/2016, and 68 on 11/05/2016.  Goal ferritin is < 50.  She has a history of leukopenia and macrocytic RBC indices.  She denies any liver disease.  Work-up on 10/10/2010 included a normal H54, folic acid, ANA, rheumatoid factor, serum protein electrophoresis, and flow cytometry.  Hepatitis B core antibody total, hepatitis B surface antigen, and hepatitis C antibody were negative on 09/20/2015.    Symptomatically, she feels "alright".  She has some fatigue and exertional dyspnea.  No bleeding, B symptoms, or recurrent infections reported.  Exam is grossly unremarkable.  Plan: 1. Labs today:  CBC with diff, CMP, ferritin, B12, folate, AFP. 2. Hemochromatosis - stable  Hemoglobin 13.0, hematocrit 37.8, MCV 102.6, and platelets 279,000.    Ferritin pending at time of visit.  Reviewed that goal ferritin is  < 50. We will plan on calling patient with ferritin level to determine +/- need for small-volume therapeutic phlebotomy.  Reviewed need for routine  monitoring of patient's AFP level and RUQ ultrasound every 6 months due to increased risk for developing hepatocellular carcinoma.  AFP level being checked today.  We will schedule patient for RUQ ultrasound. 3. B12 deficiency -ongoing  MCV 102.6.  Will check B12 level today. 4. RTC in 3 months for labs (CBC with differential, ferritin), and +/- phlebotomy. 5. RTC in 6 months for MD assessment, labs (CBC with  differential, CMP, ferritin-day before), and +/-phlebotomy.  Addendum:  B12 level today is 181 (low).  Will call patient regarding B12 supplementation.   Honor Loh, NP  03/18/2018, 3:40 PM   I saw and evaluated the patient, participating in the key portions of the service and reviewing pertinent diagnostic studies and records.  I reviewed the nurse practitioner's note and agree with the findings and the plan.  The assessment and plan were discussed with the patient.  Several questions were asked by the patient and answered.   Nolon Stalls, MD 03/18/2018,3:40 PM

## 2018-03-18 NOTE — Progress Notes (Signed)
Patient offers no complaints today. 

## 2018-03-19 LAB — VITAMIN B12: Vitamin B-12: 181 pg/mL (ref 180–914)

## 2018-03-19 LAB — FOLATE: Folate: 26 ng/mL (ref >5.9)

## 2018-03-19 LAB — FERRITIN: Ferritin: 60 ng/mL (ref 11–307)

## 2018-03-19 LAB — AFP TUMOR MARKER: AFP, Serum, Tumor Marker: 4.5 ng/mL (ref 0.0–8.3)

## 2018-03-20 ENCOUNTER — Telehealth: Payer: Self-pay | Admitting: *Deleted

## 2018-03-20 NOTE — Telephone Encounter (Signed)
-----   Message from Bryan E Gray, NP sent at 03/19/2018  5:23 PM EDT ----- B12 level is very low. Ask her to start oral B12 1,000 mcg daily.   We will check B12 level in 1 month.  She she has not improved at that time, she will need to begin B12 injections.   Bryan  

## 2018-03-20 NOTE — Telephone Encounter (Signed)
Attempted to call patient.  Voice mailbox full.  Will try later.

## 2018-03-23 ENCOUNTER — Other Ambulatory Visit: Payer: Self-pay | Admitting: *Deleted

## 2018-03-23 ENCOUNTER — Telehealth: Payer: Self-pay | Admitting: *Deleted

## 2018-03-23 DIAGNOSIS — E538 Deficiency of other specified B group vitamins: Secondary | ICD-10-CM

## 2018-03-23 DIAGNOSIS — R7989 Other specified abnormal findings of blood chemistry: Secondary | ICD-10-CM

## 2018-03-23 NOTE — Telephone Encounter (Signed)
Called patient to inform her that her B-12 is very low.  MD recommends taking B-12 1000 mcg daily and we will recheck in a month.  If not improved at that time, would consider B-12  Injections.  Patient verbalized understanding. States she has been taking oral B-12 but not every day.  Sent scheduling message to schedule lab in one month.

## 2018-03-23 NOTE — Telephone Encounter (Signed)
-----   Message from Verlee MonteBryan E Gray, NP sent at 03/19/2018  5:23 PM EDT ----- B12 level is very low. Ask her to start oral B12 1,000 mcg daily.   We will check B12 level in 1 month.  She she has not improved at that time, she will need to begin B12 injections.   Judie GrieveBryan

## 2018-04-23 ENCOUNTER — Other Ambulatory Visit: Payer: Commercial Managed Care - PPO

## 2018-04-23 ENCOUNTER — Inpatient Hospital Stay: Payer: Commercial Managed Care - PPO | Attending: Hematology and Oncology

## 2018-04-23 DIAGNOSIS — E538 Deficiency of other specified B group vitamins: Secondary | ICD-10-CM | POA: Insufficient documentation

## 2018-04-23 DIAGNOSIS — R7989 Other specified abnormal findings of blood chemistry: Secondary | ICD-10-CM

## 2018-04-23 LAB — VITAMIN B12: Vitamin B-12: 1112 pg/mL — ABNORMAL HIGH (ref 180–914)

## 2018-04-28 ENCOUNTER — Ambulatory Visit
Admission: RE | Admit: 2018-04-28 | Discharge: 2018-04-28 | Disposition: A | Discharge status: Home / Self Care | Payer: Commercial Managed Care - PPO | Source: Ambulatory Visit | Attending: Urgent Care | Admitting: Urgent Care

## 2018-05-04 ENCOUNTER — Telehealth: Payer: Self-pay | Admitting: *Deleted

## 2018-05-04 NOTE — Telephone Encounter (Signed)
-----   Message from Rosey BathMelissa C Corcoran, MD sent at 05/04/2018  3:04 PM EDT ----- Regarding: FW: us results Contact: [phone]  Please call patient with result.  M  ----- Message ----- From: Reggy EyeFuller, Robin G Sent: 05/04/2018   1:47 PM EDT To: Verlee MonteBryan E Gray, NP, Rosey BathMelissa C Corcoran, MD Subject: us results                                     Patient had an US and would like to know the results. Please call!

## 2018-05-04 NOTE — Telephone Encounter (Signed)
Called patient to give her US results, per Dr. Merlene Pullingorcoran.

## 2018-06-11 ENCOUNTER — Other Ambulatory Visit: Payer: Self-pay | Admitting: *Deleted

## 2018-06-17 ENCOUNTER — Other Ambulatory Visit: Payer: Commercial Managed Care - PPO

## 2018-07-14 IMAGING — US US ABDOMEN LIMITED
1 series · 14 of 25 positions shown · non-contrast
Comparison: 11/27/2010

CLINICAL DATA: Hemochromatosis.

EXAM:
US ABDOMEN LIMITED - RIGHT UPPER QUADRANT

[Series 1: us abdomen limited · 0.17mm/px · 14 of 42 slices shown]
[im 1/42]
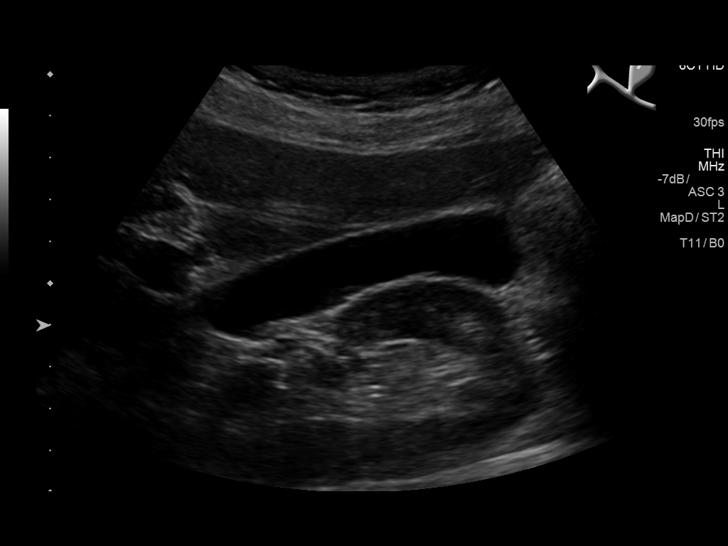
[im 4/42]
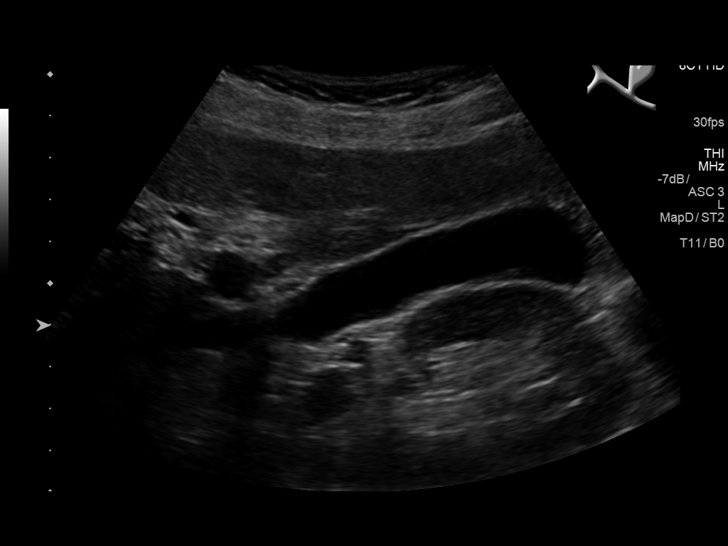
[im 7/42]
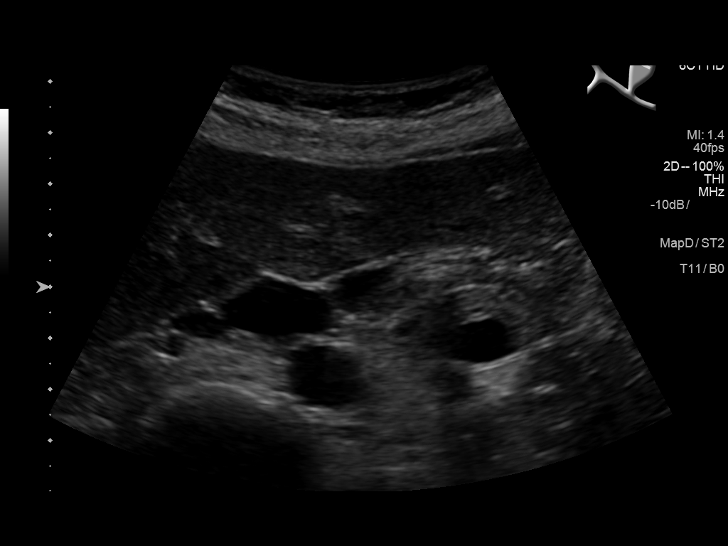
[im 11/42]
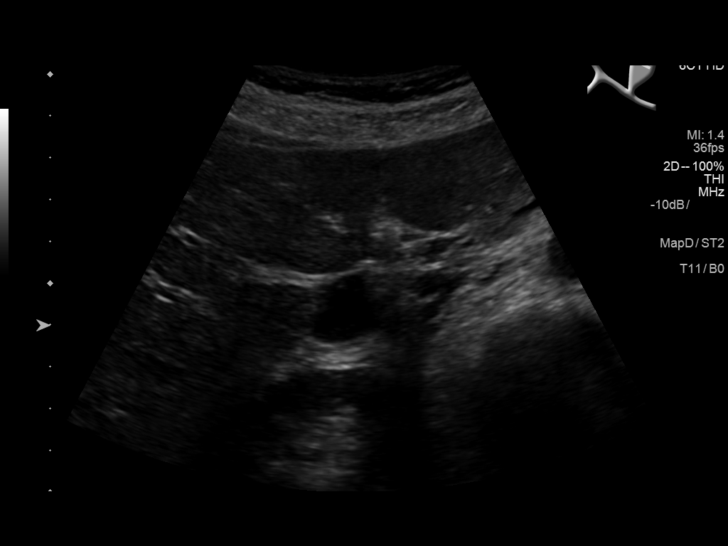
[im 14/42]
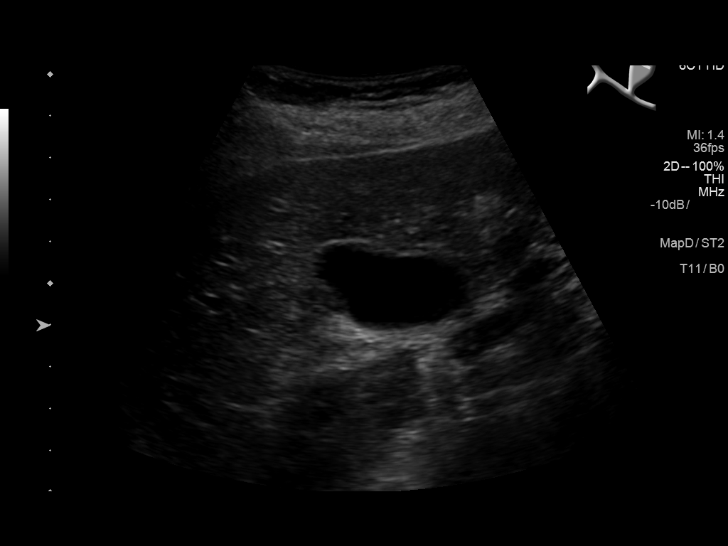
[im 16/42]
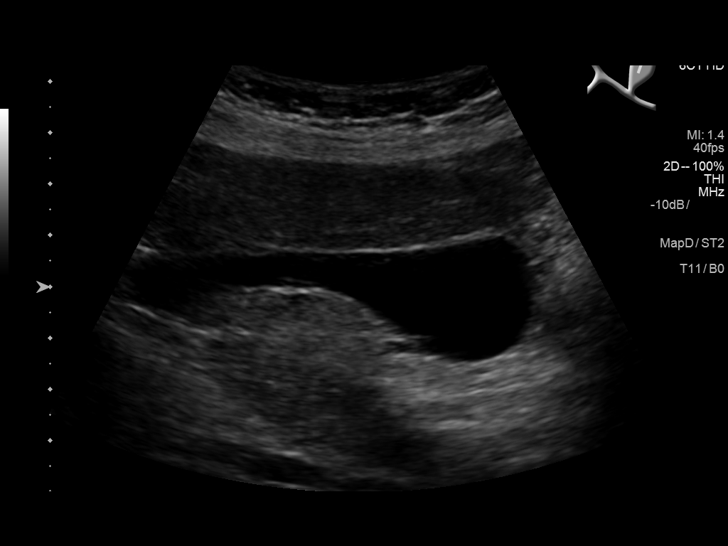
[im 19/42]
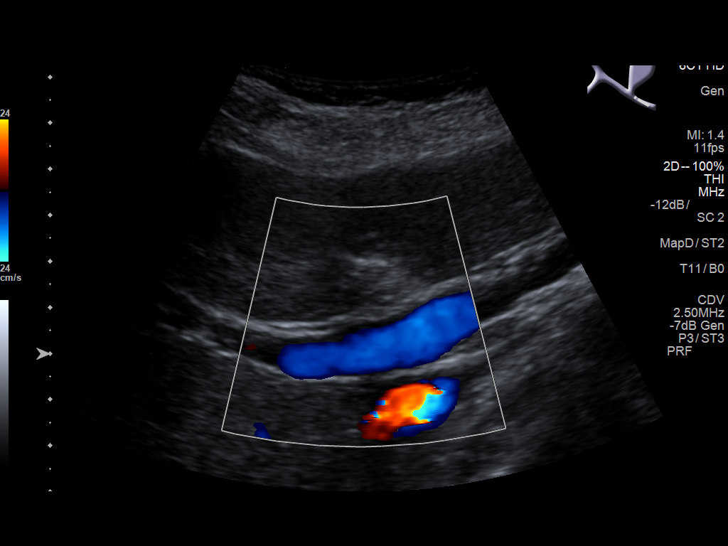
[im 23/42]
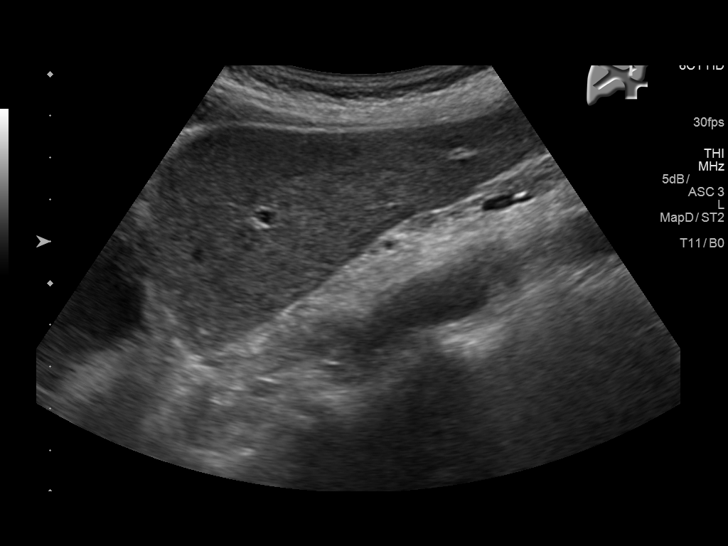
[im 26/42]
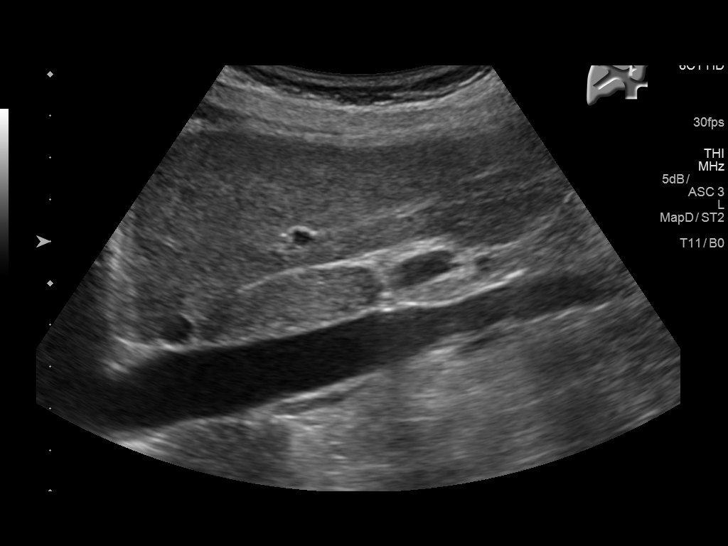
[im 28/42]
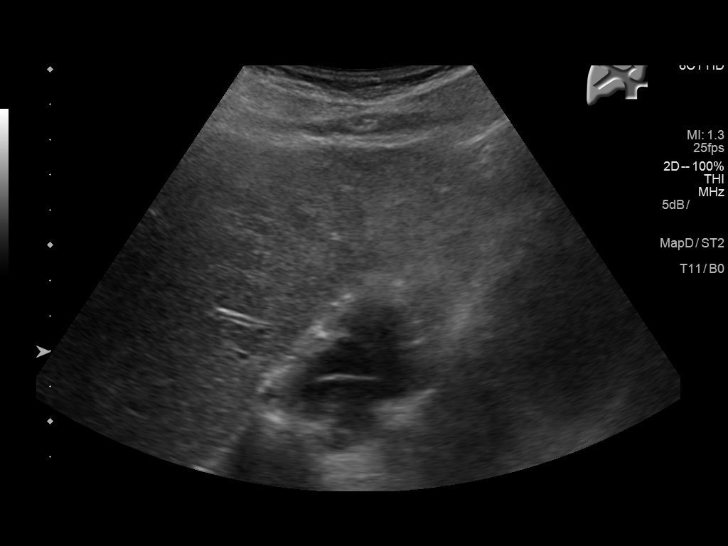
[im 31/42]
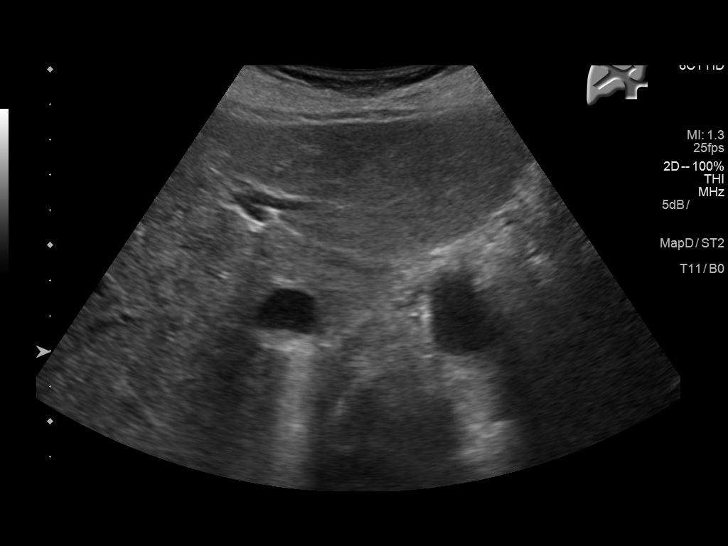
[im 35/42]
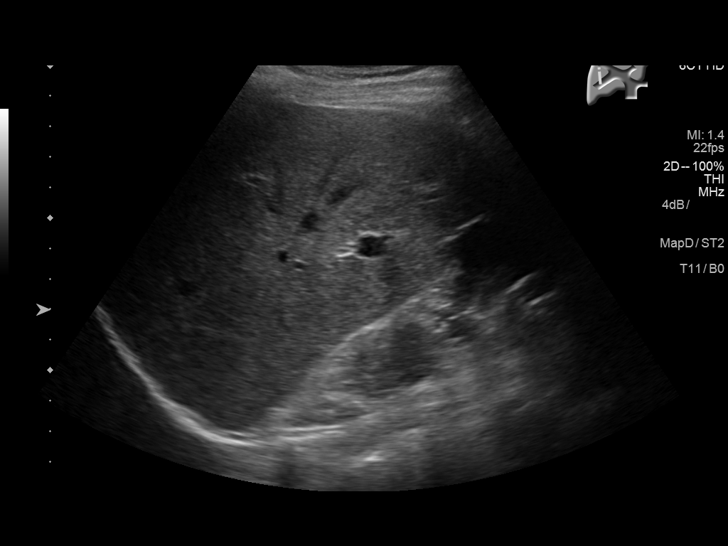
[im 38/42]
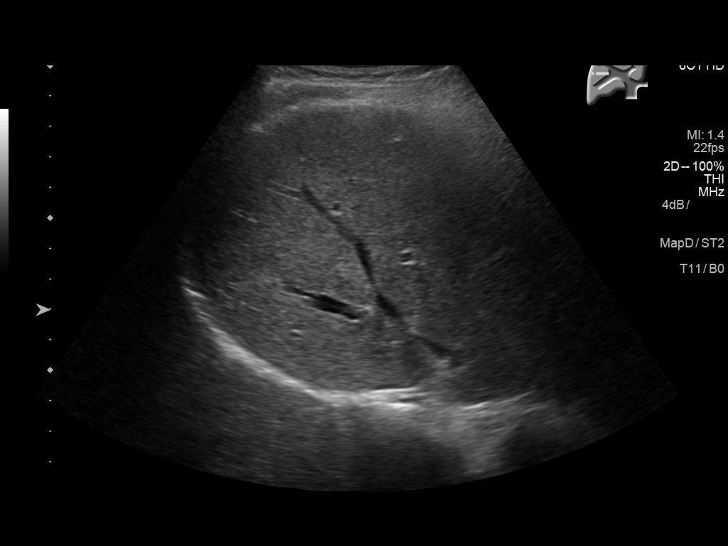
[im 42/42]
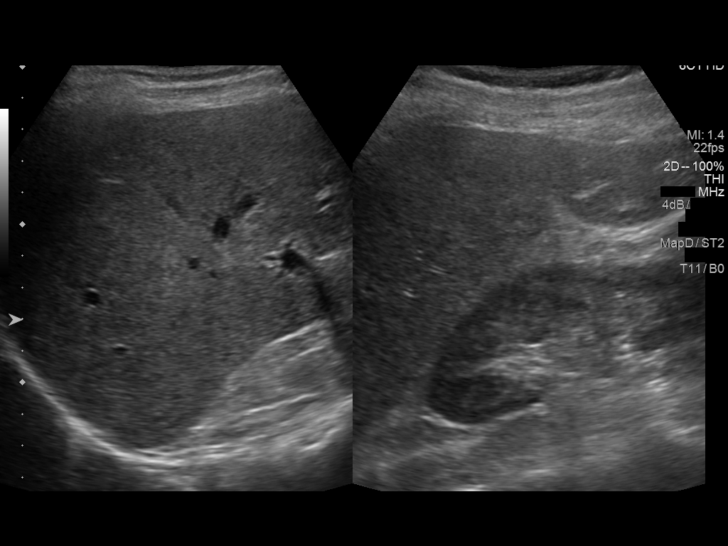

[14 of 25 positions shown; findings below may reference images not displayed]

FINDINGS: Gallbladder:

No gallstones or wall thickening visualized. No sonographic Murphy
sign noted by sonographer.

Common bile duct:

Diameter: Normal caliber, 1.2 mm

Liver:

Slight increased echotexture throughout the liver. No focal
abnormality. No biliary duct dilatation.
IMPRESSION: Slight increased echotexture throughout the liver. No focal
abnormality.

## 2018-09-15 ENCOUNTER — Other Ambulatory Visit: Payer: Commercial Managed Care - PPO

## 2018-09-16 ENCOUNTER — Ambulatory Visit: Payer: Commercial Managed Care - PPO | Admitting: Hematology and Oncology

## 2019-01-12 IMAGING — US US ABDOMEN LIMITED
1 series · 14 of 25 positions shown · non-contrast
Comparison: Right upper quadrant ultrasound March 25, 2016

CLINICAL DATA: Hereditary hemochromatosis, mastocytosis. Decreased
vitamin B12 levels.

EXAM:
US ABDOMEN LIMITED - RIGHT UPPER QUADRANT

[Series 1: us abdomen limited · 0.15mm/px · 14 of 42 slices shown]
[im 1/42]
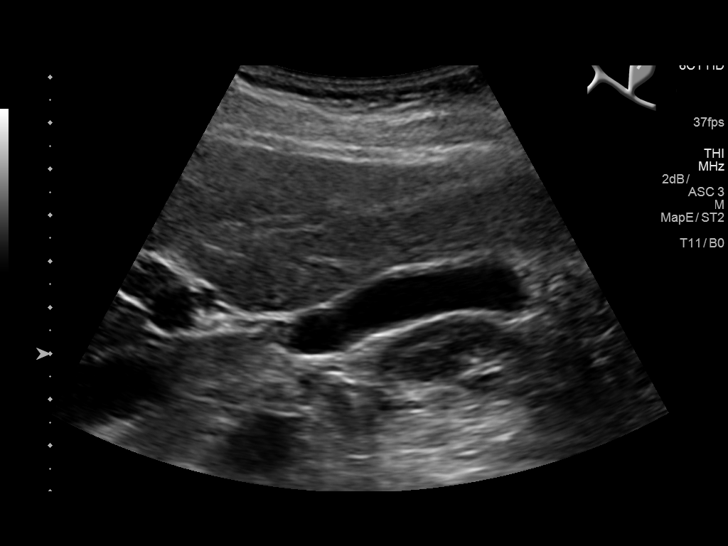
[im 4/42]
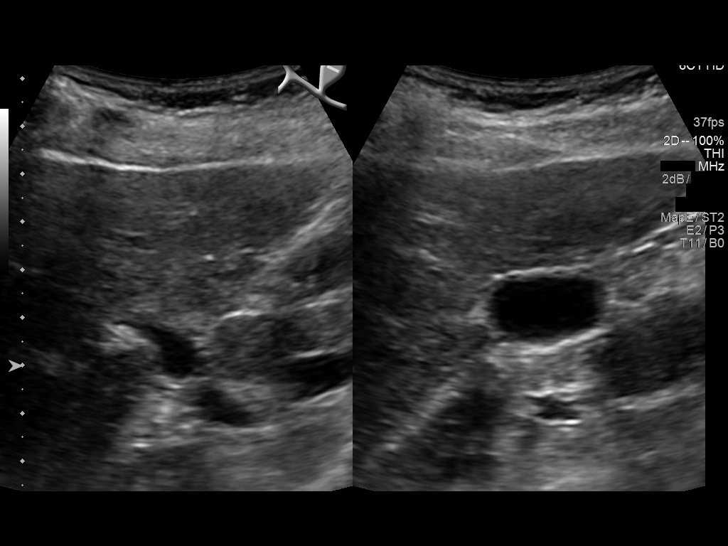
[im 7/42]
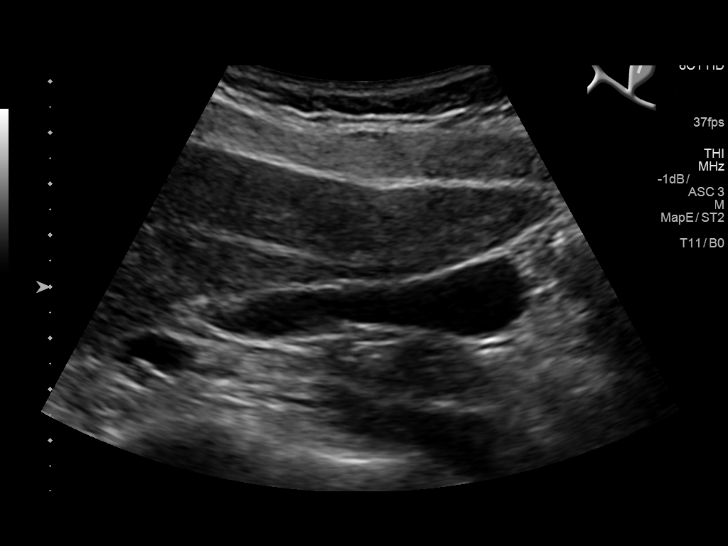
[im 11/42]
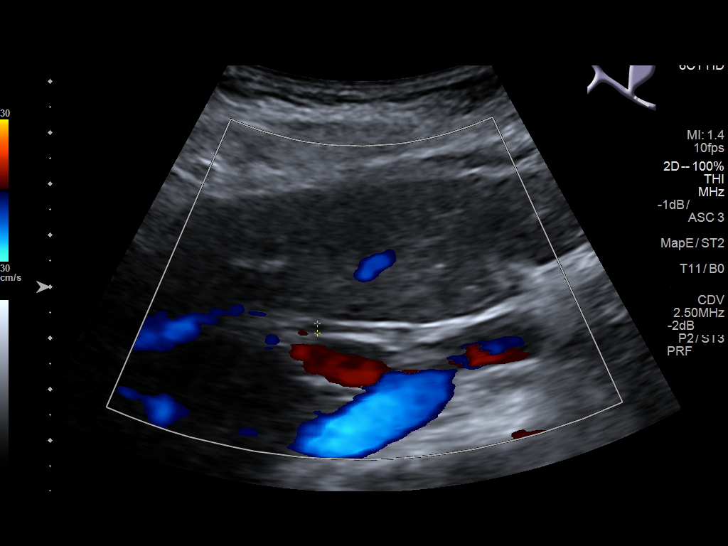
[im 14/42]
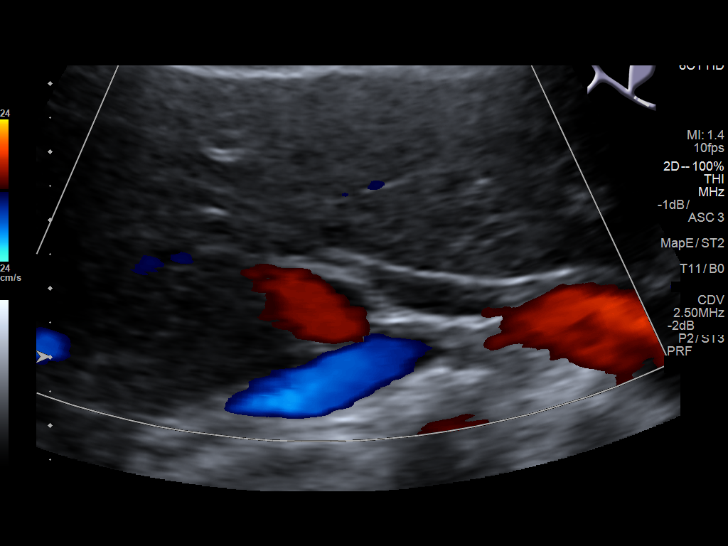
[im 16/42]
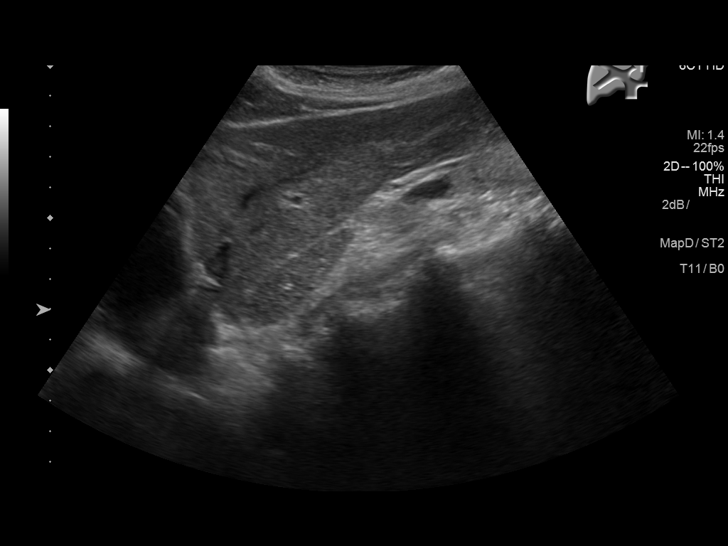
[im 19/42]
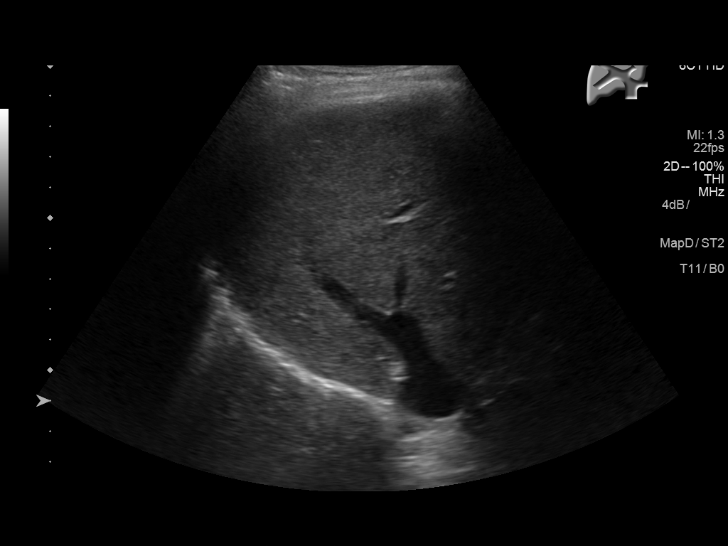
[im 23/42]
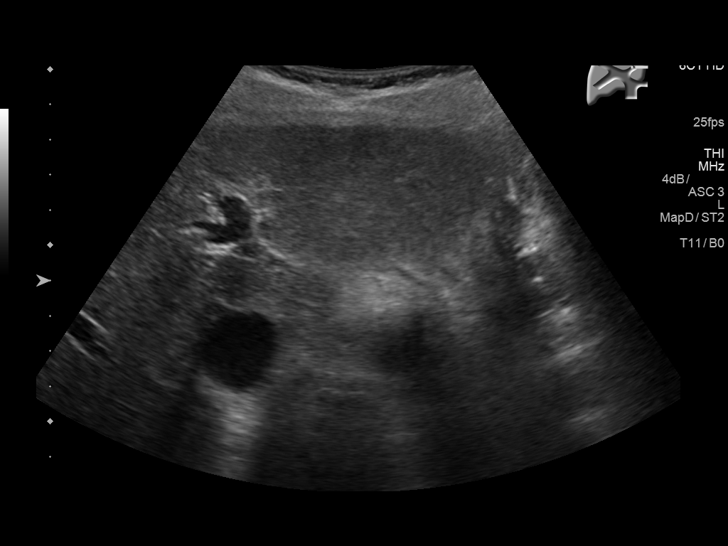
[im 26/42]
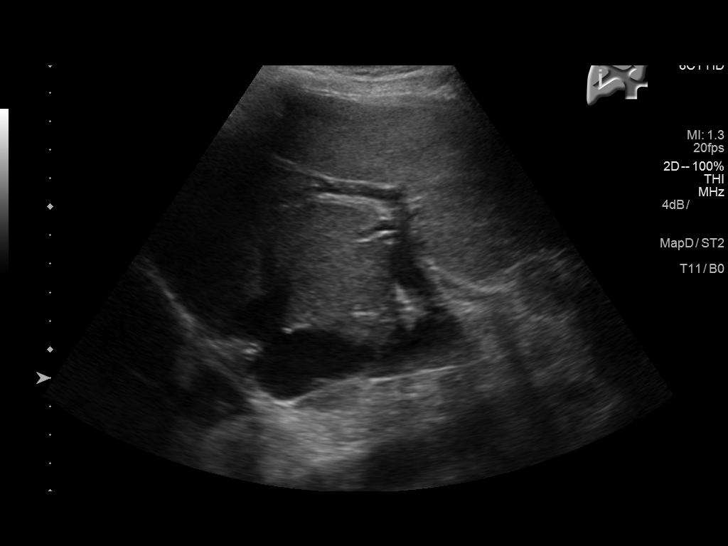
[im 28/42]
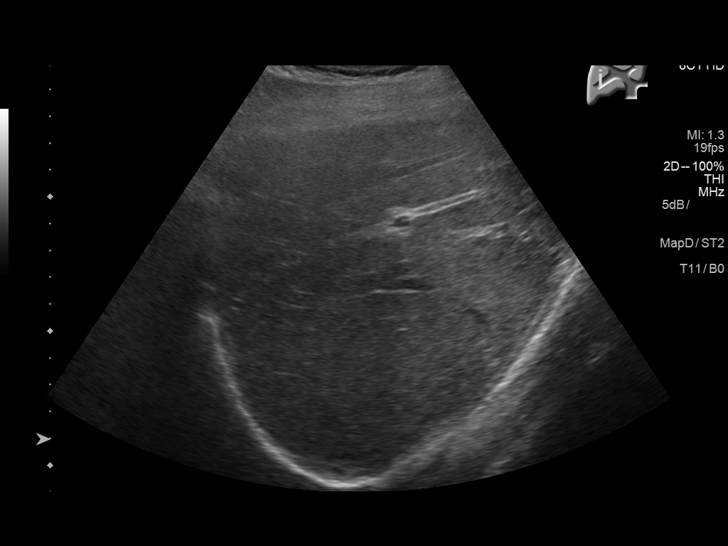
[im 31/42]
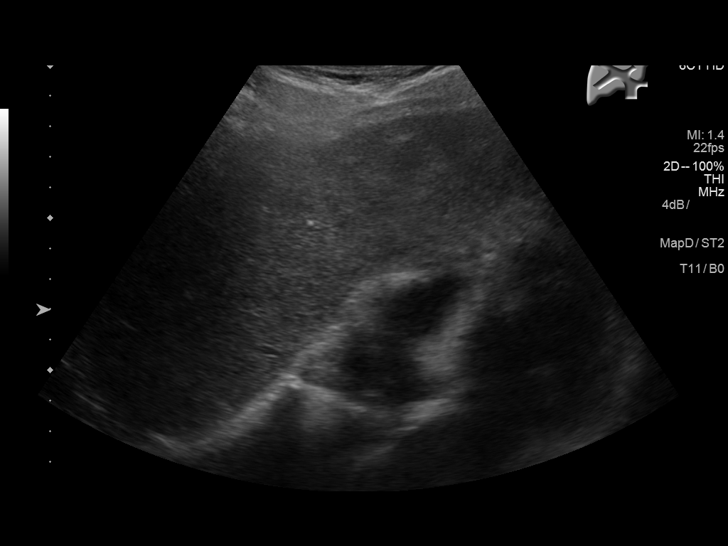
[im 35/42]
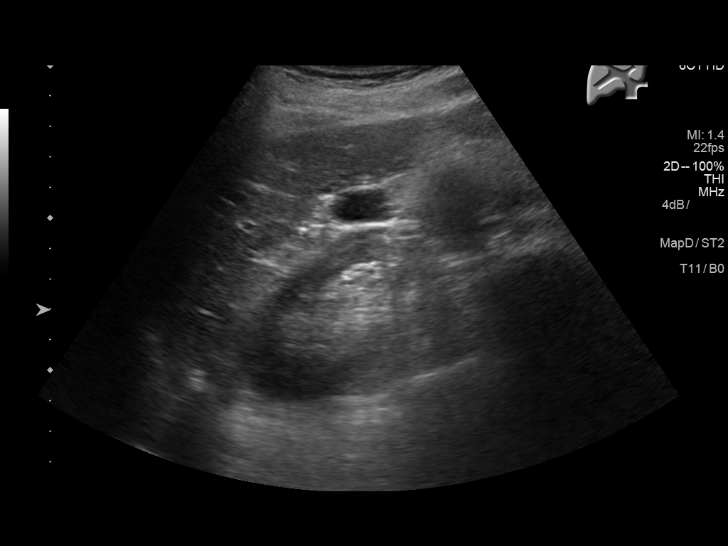
[im 38/42]
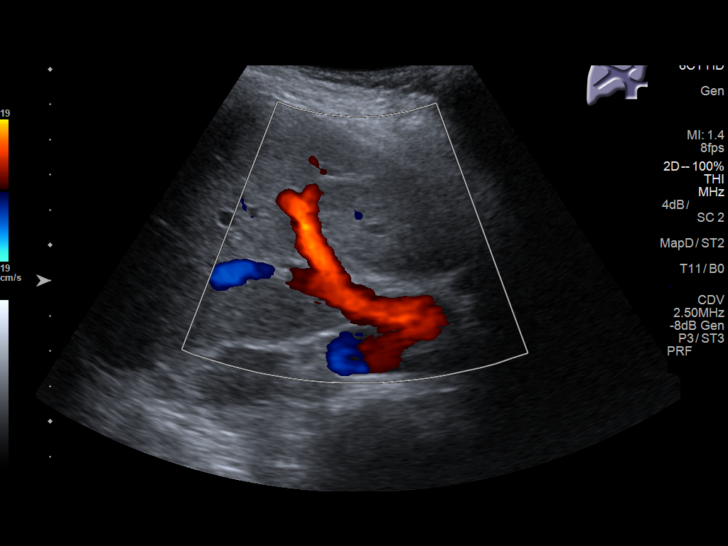
[im 42/42]
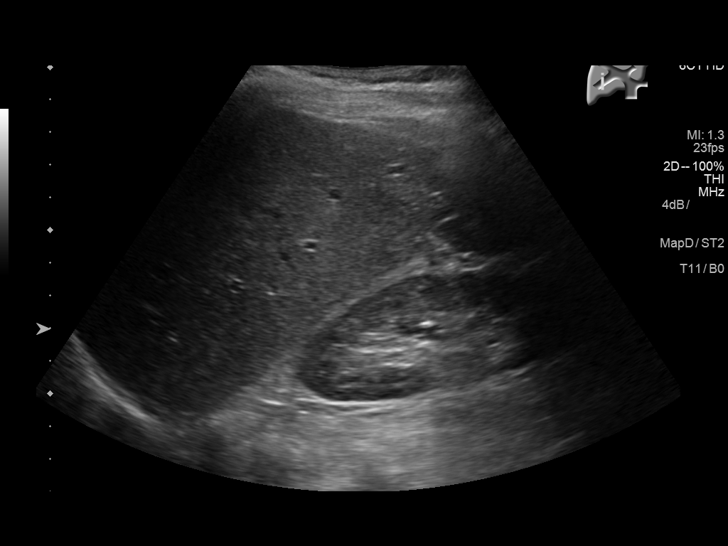

[14 of 25 positions shown; findings below may reference images not displayed]

FINDINGS: Gallbladder:

No gallstones or wall thickening visualized. No sonographic Murphy
sign noted by sonographer.

Common bile duct:

Diameter: 1.9 mm

Liver:

Again demonstrated is mildly increased hepatic echotexture. The
surface contour of the liver is normal. There is no focal mass or
ductal dilation. There is no ascites observed in the right upper
quadrant.
IMPRESSION: Mildly increased hepatic echotexture may reflect fatty infiltrative
change or other hepatocellular disease. This is stable. There no
cirrhotic changes nor findings suspicious for hepatic masses.

Normal appearance of the gallbladder and common bile duct.

## 2019-04-12 ENCOUNTER — Other Ambulatory Visit: Payer: Self-pay

## 2019-04-12 DIAGNOSIS — Z20822 Contact with and (suspected) exposure to covid-19: Secondary | ICD-10-CM

## 2019-04-14 LAB — NOVEL CORONAVIRUS, NAA: SARS-CoV-2, NAA: NOT DETECTED

## 2020-08-16 IMAGING — US US ABDOMEN LIMITED
1 series · 14 of 25 positions shown · non-contrast
Comparison: 09/23/2016

CLINICAL DATA: Hereditary hemochromatosis

EXAM:
ULTRASOUND ABDOMEN LIMITED RIGHT UPPER QUADRANT

[Series 1: us abdomen limited · 0.20mm/px · 14 of 54 slices shown]
[im 1/54]
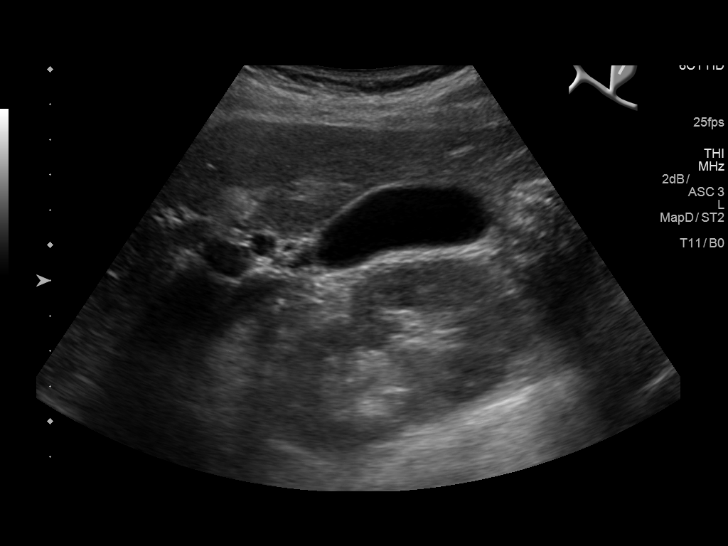
[im 5/54]
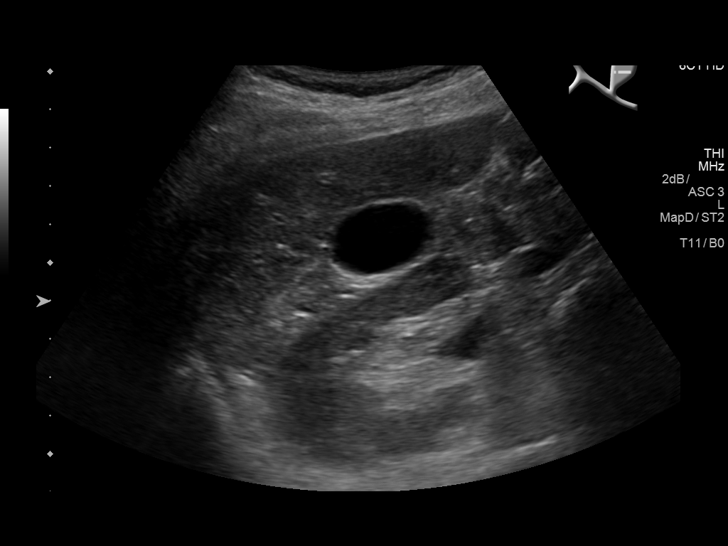
[im 9/54]
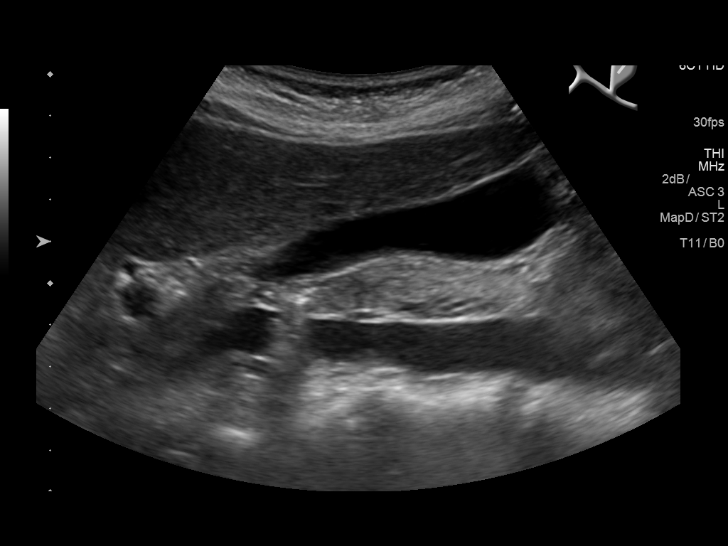
[im 14/54]
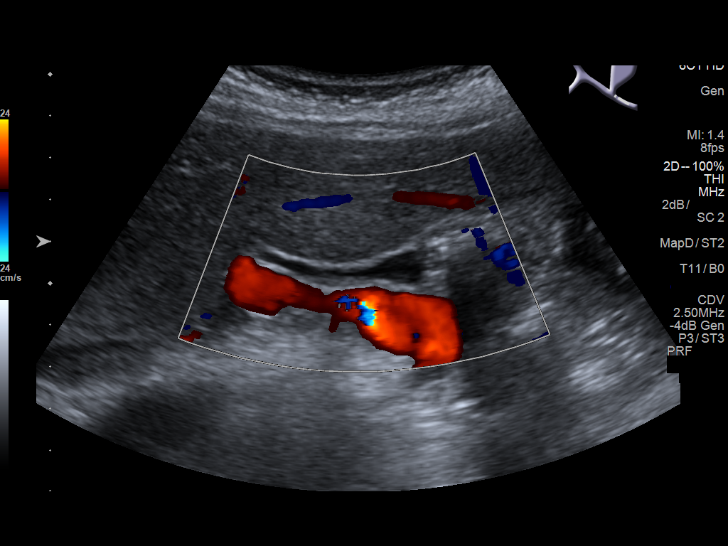
[im 18/54]
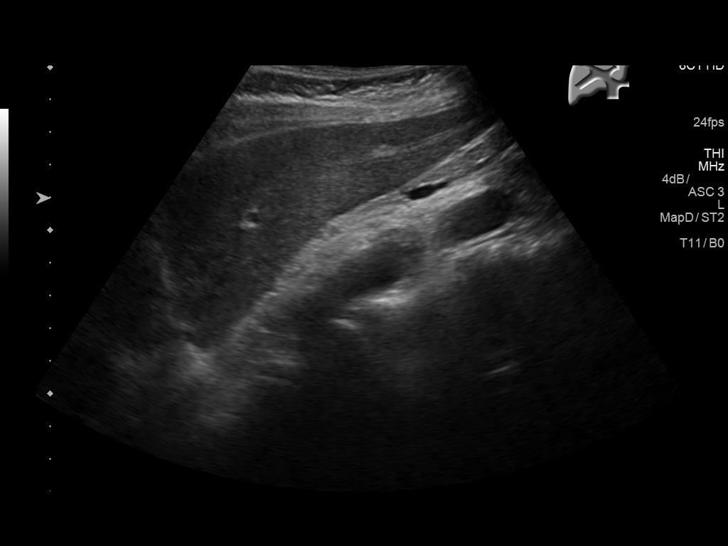
[im 20/54]
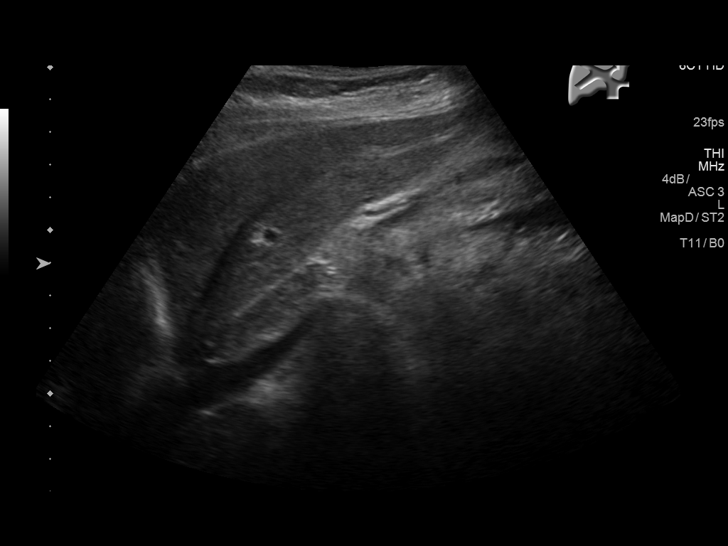
[im 25/54]
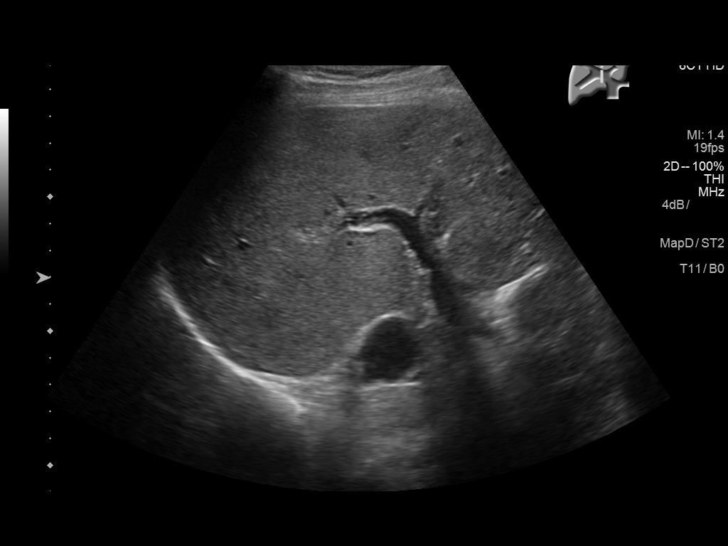
[im 29/54]
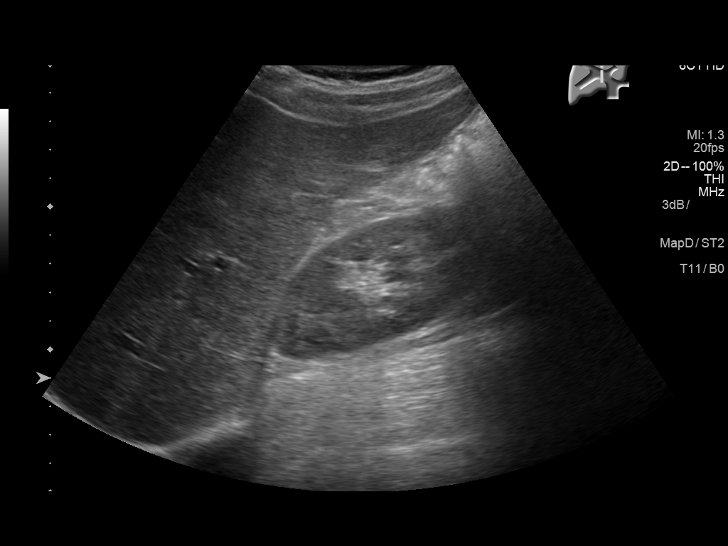
[im 34/54]
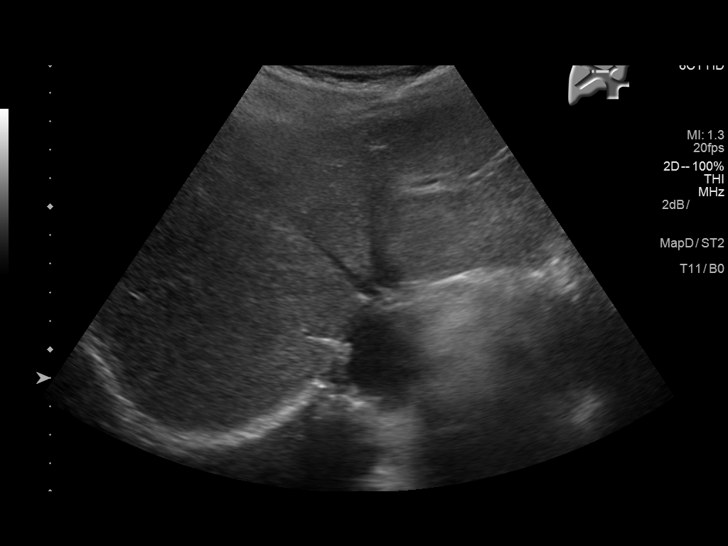
[im 36/54]
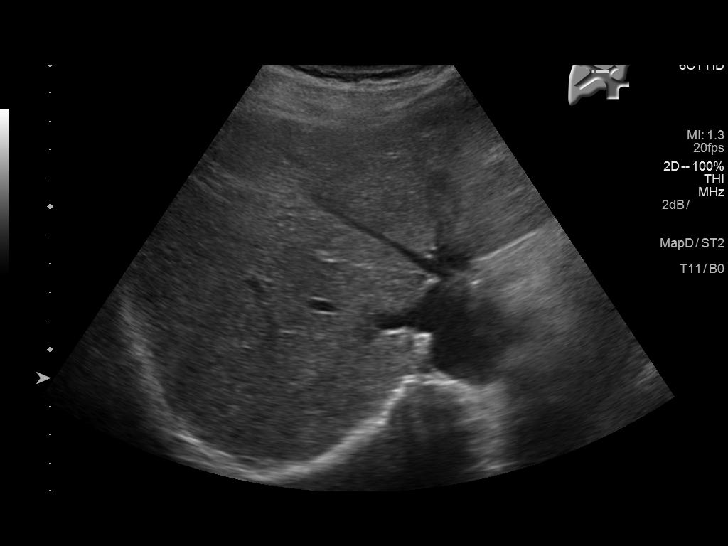
[im 40/54]
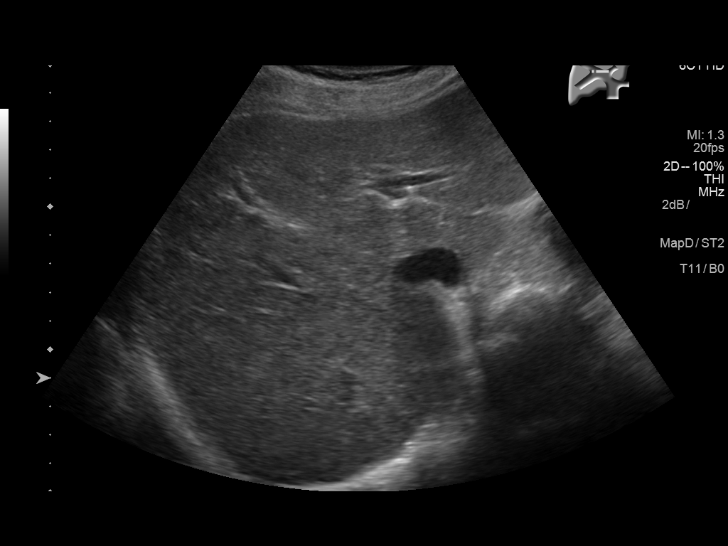
[im 45/54]
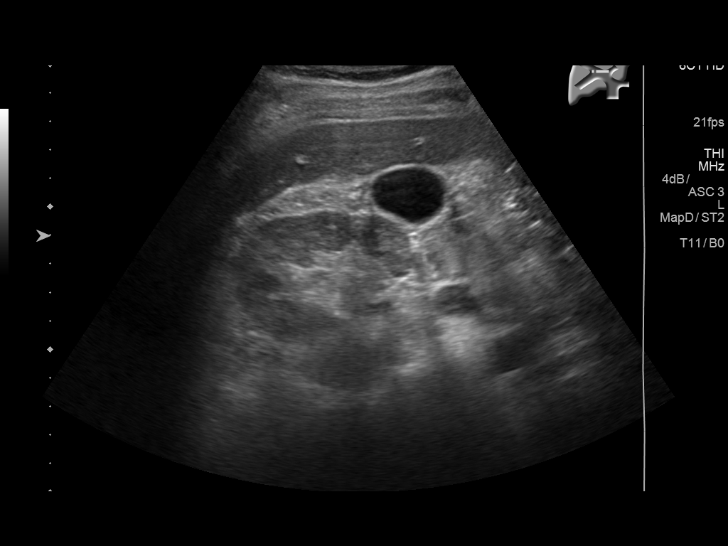
[im 49/54]
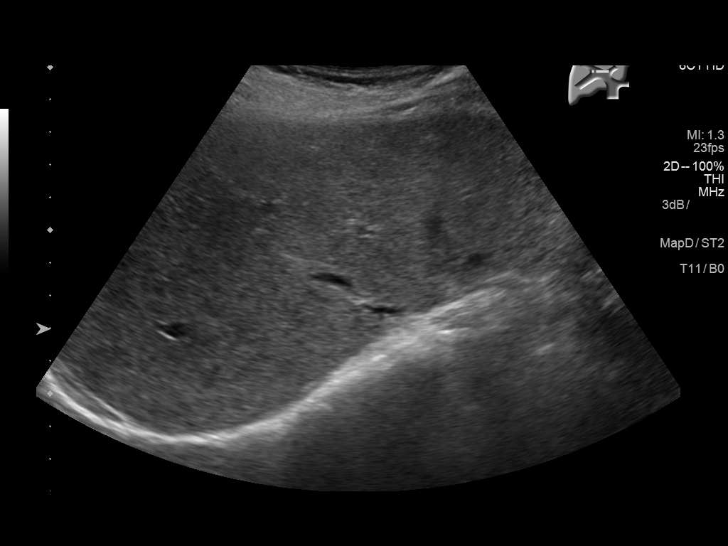
[im 54/54]
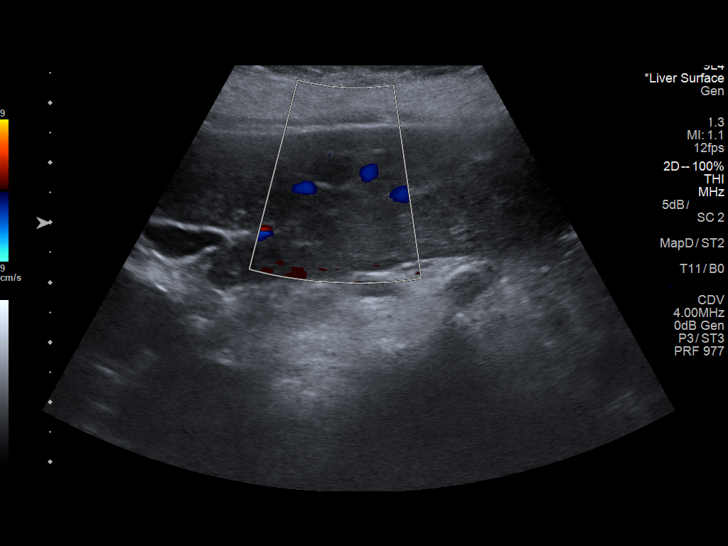

[14 of 25 positions shown; findings below may reference images not displayed]

FINDINGS: Gallbladder:

No gallstones or wall thickening visualized. No sonographic Murphy
sign noted by sonographer.

Common bile duct:

Diameter: Upper limits normal in caliber, 7 mm

Liver:

Echotexture is normal. Small hypoechoic area anteriorly in the left
hepatic lobe measures up to 9 mm, difficult to characterize due to
its small size but likely small cyst. Portal vein is patent on color
Doppler imaging with normal direction of blood flow towards the
liver.
IMPRESSION: Small hypoechoic area anteriorly within the left hepatic lobe, 9 mm,
likely small cyst.

Normal hepatic echotexture.

## 2020-08-27 ENCOUNTER — Other Ambulatory Visit: Payer: Self-pay | Admitting: Family Medicine

## 2020-08-27 DIAGNOSIS — Z1231 Encounter for screening mammogram for malignant neoplasm of breast: Secondary | ICD-10-CM

## 2022-06-24 ENCOUNTER — Other Ambulatory Visit: Payer: Self-pay | Admitting: Nurse Practitioner

## 2022-06-24 DIAGNOSIS — Z1231 Encounter for screening mammogram for malignant neoplasm of breast: Secondary | ICD-10-CM

## 2022-07-05 ENCOUNTER — Encounter: Payer: Self-pay | Admitting: Hematology and Oncology

## 2022-07-05 ENCOUNTER — Ambulatory Visit
Admission: EM | Admit: 2022-07-05 | Discharge: 2022-07-05 | Disposition: A | Discharge status: Home / Self Care | Payer: BLUE CROSS/BLUE SHIELD | Attending: Physician Assistant | Admitting: Physician Assistant

## 2022-07-05 DIAGNOSIS — R220 Localized swelling, mass and lump, head: Secondary | ICD-10-CM | POA: Diagnosis not present

## 2022-07-05 DIAGNOSIS — J029 Acute pharyngitis, unspecified: Secondary | ICD-10-CM | POA: Diagnosis not present

## 2022-07-05 LAB — GROUP A STREP BY PCR: Group A Strep by PCR: NOT DETECTED

## 2022-07-05 MED ORDER — DIPHENHYDRAMINE HCL 12.5 MG/5ML PO ELIX
25.0000 mg | ORAL_SOLUTION | Freq: Once | ORAL | Status: AC
  Administered 2022-07-05: 25 mg via ORAL

## 2022-07-05 NOTE — ED Triage Notes (Signed)
Pt c/o tongue swelling, sore throat, dizziness and "drunk feeling", Ear problem x2days.  Pt states that she had 9/10 pain in her tongue and it began swelling 3 hours ago and had trouble breathing.  Pt denies eating anything different or new.   Pt states that she was around people who had tongue sores on 07/05/23  Pt denies taking any medication and tried to drink cold "stuff"

## 2022-07-05 NOTE — Discharge Instructions (Signed)
-  Your strep test was negative.  It sounds like you might of had a mild allergic reaction to what ever you consumed earlier.  We have given you Benadryl in the clinic.  Avoid that type of food in the future and speak with your primary care provider about allergy testing. - If this occurs again make sure you take Benadryl right away and get to the emergency department.

## 2022-07-05 NOTE — ED Provider Notes (Signed)
[facility]    CSN: [id number] Arrival date & time: 07/05/22  1809      History   Chief Complaint Chief Complaint  Patient presents with   Oral Swelling   Sore Throat    HPI Lindsey Medina is a 61 y.o. female presenting for evaluation of tongue swelling for the past 3 hours.  She says she started to have tongue swelling after eating chocolate milk a.  Reports her tongue swelled up so bad with touching the corners of her mouth.  She reports a little fatigue and sore throat as well.  Reports some shortness of breath when this occurred.  She says she did not take any medication but it has gradually and slowly improved and does not really feel swollen anymore.  Mild discomfort when swallowing at this time.  No fever.  No wheezing or shortness of breath this time.  Reports similar symptoms in the past but says she never figured out what it was due to and it resolved on its own.  Take Zyrtec every day for allergies.  No other complaints.  HPI  Past Medical History:  Diagnosis Date   Allergy    Anxiety    Arthritis    Bipolar disorder ([facility])    Depression    Hemochromatosis 01/03/2015   Hemochromatosis 01/03/2015   Hypercholesteremia    Hypertension    Irregular heart beats    Raynaud phenomenon     Patient Active Problem List   Diagnosis Date Noted   Low vitamin B12 level 03/18/2018   Macrocytosis 09/18/2016   Hemochromatosis 01/03/2015    Past Surgical History:  Procedure Laterality Date   NO PAST SURGERIES      OB History   No obstetric history on file.      Home Medications    Prior to Admission medications   Medication Sig Start Date End Date Taking? Authorizing Provider  buPROPion (WELLBUTRIN XL) 150 MG 24 hr tablet Take 300 mg by mouth daily. Dr Omelia Blackwater   Yes [provider]  cetirizine (ZYRTEC) 10 MG tablet Take 1 tablet (10 mg total) by mouth daily. 10/27/15  Yes Duanne Limerick, MD  MYDAYIS 37.5 MG CP24 Take 37.5 mg by mouth daily.  09/12/16  Yes [provider]  QUEtiapine (SEROQUEL) 300 MG tablet Take 300 mg by mouth daily. 05/21/17  Yes [provider]  spironolactone (ALDACTONE) 50 MG tablet Take 50 mg by mouth 2 (two) times daily. derm   Yes [provider]    Family History Family History  Problem Relation Age of Onset   Leukemia Other    Diabetes Father     Social History Social History   Tobacco Use   Smoking status: Former    Years: 15.00    Types: Cigarettes    Quit date: 12/06/1988    Years since quitting: 33.6   Smokeless tobacco: Never  Vaping Use   Vaping Use: Never used  Substance Use Topics   Alcohol use: Yes    Comment: Socially wine   Drug use: No     Allergies   Morphine   Review of Systems Review of Systems  Constitutional:  Negative for chills, diaphoresis, fatigue and fever.  HENT:  Positive for sore throat. Negative for congestion, ear pain, facial swelling, rhinorrhea, sinus pressure and sinus pain.   Respiratory:  Negative for cough and shortness of breath.   Gastrointestinal:  Negative for abdominal pain, nausea and vomiting.  Musculoskeletal:  Negative for  arthralgias and myalgias.  Skin:  Negative for rash.  Neurological:  Negative for weakness and headaches.  Hematological:  Negative for adenopathy.     Physical Exam Triage Vital Signs ED Triage Vitals  Enc Vitals Group     BP 07/05/22 1839 127/85     Pulse Rate 07/05/22 1839 83     Resp 07/05/22 1839 18     Temp 07/05/22 1839 98.7 F (37.1 C)     Temp Source 07/05/22 1839 Oral     SpO2 07/05/22 1839 97 %     Weight 07/05/22 1838 123 lb (55.8 kg)     Height 07/05/22 1838 5\' 3"  (1.6 m)     Head Circumference --      Peak Flow --      Pain Score 07/05/22 1836 9     Pain Loc --      Pain Edu? --      Excl. in GC? --    No data found.  Updated Vital Signs BP 127/85 (BP Location: Left Arm)   Pulse 83   Temp 98.7 F (37.1 C) (Oral)   Resp 18   Ht 5\' 3"  (1.6 m)   Wt 123 lb  (55.8 kg)   SpO2 97%   BMI 21.79 kg/m       Physical Exam Vitals and nursing note reviewed.  Constitutional:      General: She is not in acute distress.    Appearance: Normal appearance. She is not ill-appearing or toxic-appearing.  HENT:     Head: Normocephalic and atraumatic.     Nose: Nose normal.     Mouth/Throat:     Mouth: Mucous membranes are moist.     Tongue: No lesions.     Pharynx: Oropharynx is clear. Posterior oropharyngeal erythema (mild) present.     Comments: No tongue swelling Eyes:     General: No scleral icterus.       Right eye: No discharge.        Left eye: No discharge.     Conjunctiva/sclera: Conjunctivae normal.  Cardiovascular:     Rate and Rhythm: Normal rate and regular rhythm.     Heart sounds: Normal heart sounds.  Pulmonary:     Effort: Pulmonary effort is normal. No respiratory distress.     Breath sounds: Normal breath sounds.  Musculoskeletal:     Cervical back: Neck supple.  Skin:    General: Skin is dry.  Neurological:     General: No focal deficit present.     Mental Status: She is alert. Mental status is at baseline.     Motor: No weakness.     Gait: Gait normal.  Psychiatric:        Mood and Affect: Mood normal.        Behavior: Behavior normal.        Thought Content: Thought content normal.      UC Treatments / Results  Labs (all labs ordered are listed, but only abnormal results are displayed) Labs Reviewed  GROUP A STREP BY PCR    EKG   Radiology No results found.  Procedures Procedures (including critical care time)  Medications Ordered in UC Medications  diphenhydrAMINE (BENADRYL) 12.5 MG/5ML elixir 25 mg (has no administration in time range)    Initial Impression / Assessment and Plan / UC Course  I have reviewed the triage vital signs and the nursing notes.  Pertinent labs & imaging results that were available during my care of the  patient were reviewed by me and considered in my medical decision  making (see chart for details).   61 year old female presents for evaluation of tongue swelling that has been slowly improving over the past 3 hours.  Symptoms started while she was eating chocolate mousse cake.  Denies any known allergy to any ingredients in this case.  Has not taken anything for the swelling.  Reports mild sore throat at this time.  No breathing difficulty or wheezing.  On examination she has mild posterior pharyngeal erythema without swelling and no tongue swelling.  Chest clear to auscultation heart regular rate and rhythm.  PCR strep is negative.  Advised patient is possible she had a mild allergy to what ever she was consuming.  She requests allergy medication here so she has been given 10 mL Benadryl elixir.  Encouraged plenty rest and fluids.  Advised avoiding ingredients in that particular cake until she can get allergy tested through her PCP office.  Advise if symptoms like this happen again she needs to take Benadryl right away and call 911 or go to ER.   Final Clinical Impressions(s) / UC Diagnoses   Final diagnoses:  Sore throat  Tongue swelling     Discharge Instructions      -Your strep test was negative.  It sounds like you might of had a mild allergic reaction to what ever you consumed earlier.  We have given you Benadryl in the clinic.  Avoid that type of food in the future and speak with your primary care provider about allergy testing. - If this occurs again make sure you take Benadryl right away and get to the emergency department.     ED Prescriptions   None    PDMP not reviewed this encounter.   Shirlee Latch, PA-C 07/05/22 1919
